# Patient Record
Sex: Male | Born: 1980 | Race: White | Marital: Married | Smoking: Former smoker
Health system: Southern US, Community
[De-identification: ages and names within clinical notes are randomized; demographics above are authoritative.]

## PROBLEM LIST (undated history)

## (undated) DIAGNOSIS — I10 Essential (primary) hypertension: Secondary | ICD-10-CM

## (undated) DIAGNOSIS — T7840XA Allergy, unspecified, initial encounter: Secondary | ICD-10-CM

## (undated) DIAGNOSIS — I493 Ventricular premature depolarization: Secondary | ICD-10-CM

## (undated) DIAGNOSIS — R42 Dizziness and giddiness: Secondary | ICD-10-CM

## (undated) DIAGNOSIS — R002 Palpitations: Secondary | ICD-10-CM

## (undated) DIAGNOSIS — I491 Atrial premature depolarization: Secondary | ICD-10-CM

## (undated) DIAGNOSIS — K219 Gastro-esophageal reflux disease without esophagitis: Secondary | ICD-10-CM

## (undated) HISTORY — DX: Allergy, unspecified, initial encounter: T78.40XA

## (undated) HISTORY — DX: Essential (primary) hypertension: I10

## (undated) HISTORY — PX: OTHER SURGICAL HISTORY: SHX169

## (undated) HISTORY — DX: Palpitations: R00.2

## (undated) HISTORY — DX: Dizziness and giddiness: R42

## (undated) HISTORY — PX: COLONOSCOPY: SHX174

## (undated) HISTORY — DX: Atrial premature depolarization: I49.1

## (undated) HISTORY — DX: Gastro-esophageal reflux disease without esophagitis: K21.9

## (undated) HISTORY — DX: Ventricular premature depolarization: I49.3

---

## 2015-11-26 ENCOUNTER — Encounter: Payer: Self-pay | Admitting: Cardiology

## 2015-11-26 ENCOUNTER — Ambulatory Visit (INDEPENDENT_AMBULATORY_CARE_PROVIDER_SITE_OTHER): Payer: 59 | Admitting: Cardiology

## 2015-11-26 VITALS — BP 144/88 | HR 60 | Ht 71.0 in | Wt 234.8 lb

## 2015-11-26 DIAGNOSIS — R011 Cardiac murmur, unspecified: Secondary | ICD-10-CM

## 2015-11-26 DIAGNOSIS — R002 Palpitations: Secondary | ICD-10-CM | POA: Diagnosis not present

## 2015-11-26 DIAGNOSIS — R079 Chest pain, unspecified: Secondary | ICD-10-CM

## 2015-11-26 DIAGNOSIS — I491 Atrial premature depolarization: Secondary | ICD-10-CM | POA: Insufficient documentation

## 2015-11-26 NOTE — Progress Notes (Signed)
Cardiology Office Note   Date:  11/26/2015   ID:  Spencer Jackson, DOB 1981/07/09, MRN [id number]  PCP:  No primary care provider on file.    Chief Complaint  Patient presents with  . Chest Pain  . Palpitations      History of Present Illness: Spencer Jackson is a 35 y.o. male who presents for evaluation of.  He has a history of HTN, palpitations with PACs on EKG and dizziness in the past.  He had an echo in [place] showing normal LVF with mild LAE and mild MR. It was felt that he may have post prandial hypotension and it was recommended that he avoid ETOH at lunch and do lighter meals.   He does not smoke currently but did smoke and quit 10 year ago.  He drinks 3-4 alcoholic drinks per day.  He is here today because he had some palpitations a week ago and also wants his BP evaluated.  He says that the palpitations are intermittent and have occurred for years.  It can occur when he is sitting at his desk.  It feels like a skipped beat.  He will notice it some when he is exercising.  He has had some chest pain that is midsternal with no radiation and is nonexertional.  He has no associated symptoms of SOB, diaphoresis or nausea.  He can exercise without any problems except for the palpitations that sometimes make him stop.  He denies any LE edema, PND, orthopnea or syncope.      Past Medical History  Diagnosis Date  . Palpitations   . Dizziness   . Hypertension   . PAC (premature atrial contraction)     Past Surgical History  Procedure Laterality Date  . Back cyst resection       No current outpatient prescriptions on file.   No current facility-administered medications for this visit.    Allergies:   Review of patient's allergies indicates not on file.    Social History:  The patient  reports that he quit smoking about 10 years ago. He does not have any smokeless tobacco history on file. He reports that he drinks about 5.4 oz of alcohol per week. He reports that he  does not use illicit drugs.   Family History:  The patient's family history includes Hepatitis in his mother.    ROS:  Please see the history of present illness.   Otherwise, review of systems are positive for none.   All other systems are reviewed and negative.    PHYSICAL EXAM: VS:  BP 144/88 mmHg  Pulse 60  Ht 5\' 11"  (1.803 m)  Wt 234 lb 12.8 oz (106.505 kg)  BMI 32.76 kg/m2 , BMI Body mass index is 32.76 kg/(m^2). GEN: Well nourished, well developed, in no acute distress HEENT: normal Neck: no JVD, carotid bruits, or masses Cardiac: RRR; no rubs, or gallops,no edema,  1/6 SM at RUSB Respiratory:  clear to auscultation bilaterally, normal work of breathing GI: soft, nontender, nondistended, + BS MS: no deformity or atrophy Skin: warm and dry, no rash Neuro:  Strength and sensation are intact Psych: euthymic mood, full affect   EKG:  EKG is ordered today. The ekg ordered today demonstrates NSR at 60bpm with no ST changes, intervals normal   Recent Labs: No results found for requested labs within last 365 days.    Lipid Panel No results found  for: CHOL, TRIG, HDL, CHOLHDL, VLDL, LDLCALC, LDLDIRECT    Wt Readings from Last 3 Encounters:  11/26/15 234 lb 12.8 oz (106.505 kg)        ASSESSMENT AND PLAN:  1.  Chest pain which he thinks is probably indigestion but wanted to make sure.  He does not smoke currently but did 10 years ago.  His EKG is normal.  His only CRFs are remote tobacco use and  HTN.  I will get an ETT to rule out ischemia.    2.  HTN - borderline controlled.  He brought in his BP readings.  Most are after a workout.  I have encouraged him to cut out added sodium in his diet.  I have asked him to check his BP daily for a week and call with the results.   3.  Palpitations with history of PACs.  I suspect he is having PACs and PVCs.  I have encouraged him to try to cut back on caffeine and alcohol.  I will get a 30 day heart monitor to assess.   4.  Heart  murmur - check 2D echo   Current medicines are reviewed at length with the patient today.  The patient does not have concerns regarding medicines.  The following changes have been made:  no change  Labs/ tests ordered today: See above Assessment and Plan No orders of the defined types were placed in this encounter.     Disposition:   FU with me in 1 year  Signed, Sueanne Margarita, MD  11/26/2015 8:39 AM    [facility] [address], [place], [place]  [address] Phone: [phone]; Fax: [phone]

## 2015-11-26 NOTE — Patient Instructions (Addendum)
Medication Instructions:  Your physician recommends that you continue on your current medications as directed. Please refer to the Current Medication list given to you today.   Labwork: None  Testing/Procedures: Your physician has requested that you have an echocardiogram. Echocardiography is a painless test that uses sound waves to create images of your heart. It provides your doctor with information about the size and shape of your heart and how well your heart's chambers and valves are working. This procedure takes approximately one hour. There are no restrictions for this procedure.  Your physician has recommended that you wear an event monitor. Event monitors are medical devices that record the heart's electrical activity. Doctors most often [place] these monitors to diagnose arrhythmias. Arrhythmias are problems with the speed or rhythm of the heartbeat. The monitor is a small, portable device. You can wear one while you do your normal daily activities. This is usually used to diagnose what is causing palpitations/syncope (passing out).  Your physician has requested that you have an exercise tolerance test. For further information please visit [email or web address]. Please also follow instruction sheet, as given.  Follow-Up: Your physician wants you to follow-up in: 1 YEAR with Dr. Radford Pax. You will receive a reminder letter in the mail two months in advance. If you don't receive a letter, please call our office to schedule the follow-up appointment.   Any Other Special Instructions Will Be Listed Below (If Applicable).     If you need a refill on your cardiac medications before your next appointment, please call your pharmacy.

## 2015-12-08 ENCOUNTER — Ambulatory Visit: Payer: Self-pay | Admitting: Internal Medicine

## 2015-12-15 ENCOUNTER — Other Ambulatory Visit: Payer: Self-pay

## 2015-12-15 ENCOUNTER — Ambulatory Visit (INDEPENDENT_AMBULATORY_CARE_PROVIDER_SITE_OTHER): Payer: 59

## 2015-12-15 ENCOUNTER — Ambulatory Visit (HOSPITAL_COMMUNITY): Payer: 59 | Attending: Cardiovascular Disease

## 2015-12-15 ENCOUNTER — Other Ambulatory Visit (HOSPITAL_COMMUNITY): Payer: 59

## 2015-12-15 DIAGNOSIS — R079 Chest pain, unspecified: Secondary | ICD-10-CM | POA: Diagnosis not present

## 2015-12-15 DIAGNOSIS — R011 Cardiac murmur, unspecified: Secondary | ICD-10-CM | POA: Diagnosis not present

## 2015-12-15 DIAGNOSIS — Z87891 Personal history of nicotine dependence: Secondary | ICD-10-CM | POA: Insufficient documentation

## 2015-12-15 DIAGNOSIS — R002 Palpitations: Secondary | ICD-10-CM | POA: Diagnosis not present

## 2015-12-15 DIAGNOSIS — I491 Atrial premature depolarization: Secondary | ICD-10-CM | POA: Diagnosis not present

## 2015-12-15 DIAGNOSIS — I119 Hypertensive heart disease without heart failure: Secondary | ICD-10-CM | POA: Insufficient documentation

## 2015-12-15 LAB — EXERCISE TOLERANCE TEST
CHL CUP MPHR: 186 {beats}/min
CHL CUP RESTING HR STRESS: 65 {beats}/min
CHL CUP STRESS STAGE 1 HR: 54 {beats}/min
CHL CUP STRESS STAGE 10 HR: 84 {beats}/min
CHL CUP STRESS STAGE 2 GRADE: 0 %
CHL CUP STRESS STAGE 2 HR: 54 {beats}/min
CHL CUP STRESS STAGE 2 SPEED: 1 mph
CHL CUP STRESS STAGE 3 GRADE: 0.1 %
CHL CUP STRESS STAGE 5 SPEED: 2.5 mph
CHL CUP STRESS STAGE 6 GRADE: 14 %
CHL CUP STRESS STAGE 6 HR: 136 {beats}/min
CHL CUP STRESS STAGE 6 SPEED: 3.4 mph
CHL CUP STRESS STAGE 7 DBP: 66 mmHg
CHL CUP STRESS STAGE 7 GRADE: 16 %
CHL CUP STRESS STAGE 7 HR: 157 {beats}/min
CHL CUP STRESS STAGE 7 SBP: 125 mmHg
CHL CUP STRESS STAGE 8 HR: 171 {beats}/min
CHL CUP STRESS STAGE 9 SBP: 154 mmHg
CHL RATE OF PERCEIVED EXERTION: 18
CSEPED: 12 min
CSEPEDS: 53 s
CSEPEW: 14.9 METS
CSEPHR: 91 %
CSEPPHR: 171 {beats}/min
Percent of predicted max HR: 91 %
Stage 1 DBP: 82 mmHg
Stage 1 Grade: 0 %
Stage 1 SBP: 131 mmHg
Stage 1 Speed: 0 mph
Stage 10 DBP: 87 mmHg
Stage 10 Grade: 0 %
Stage 10 SBP: 152 mmHg
Stage 10 Speed: 0 mph
Stage 3 HR: 54 {beats}/min
Stage 3 Speed: 1 mph
Stage 4 DBP: 73 mmHg
Stage 4 Grade: 10 %
Stage 4 HR: 80 {beats}/min
Stage 4 SBP: 138 mmHg
Stage 4 Speed: 1.7 mph
Stage 5 DBP: 76 mmHg
Stage 5 Grade: 12 %
Stage 5 HR: 105 {beats}/min
Stage 5 SBP: 151 mmHg
Stage 6 DBP: 69 mmHg
Stage 6 SBP: 151 mmHg
Stage 7 Speed: 4.1 mph
Stage 8 Grade: 18 %
Stage 8 Speed: 5 mph
Stage 9 DBP: 68 mmHg
Stage 9 Grade: 0 %
Stage 9 HR: 130 {beats}/min
Stage 9 Speed: 1.5 mph

## 2016-02-03 ENCOUNTER — Telehealth: Payer: Self-pay | Admitting: Cardiology

## 2016-02-03 NOTE — Telephone Encounter (Signed)
New message ° ° ° ° °Returning a call to the nurse °

## 2016-02-03 NOTE — Telephone Encounter (Signed)
Informed patient of results and verbal understanding expressed.  

## 2016-02-03 NOTE — Telephone Encounter (Signed)
-----   Message from Sueanne Margarita, MD sent at 02/02/2016  9:24 PM EDT ----- Please let patient know that stress test was fine

## 2016-05-30 ENCOUNTER — Ambulatory Visit (INDEPENDENT_AMBULATORY_CARE_PROVIDER_SITE_OTHER): Payer: 59 | Admitting: Family Medicine

## 2016-05-30 ENCOUNTER — Encounter: Payer: Self-pay | Admitting: Family Medicine

## 2016-05-30 VITALS — BP 130/86 | HR 50 | Wt 233.0 lb

## 2016-05-30 DIAGNOSIS — M25812 Other specified joint disorders, left shoulder: Secondary | ICD-10-CM

## 2016-05-30 DIAGNOSIS — M7542 Impingement syndrome of left shoulder: Secondary | ICD-10-CM | POA: Diagnosis not present

## 2016-05-30 DIAGNOSIS — M4316 Spondylolisthesis, lumbar region: Secondary | ICD-10-CM | POA: Diagnosis not present

## 2016-05-30 DIAGNOSIS — Z23 Encounter for immunization: Secondary | ICD-10-CM | POA: Diagnosis not present

## 2016-05-30 DIAGNOSIS — Z114 Encounter for screening for human immunodeficiency virus [HIV]: Secondary | ICD-10-CM

## 2016-05-30 DIAGNOSIS — Z0000 Encounter for general adult medical examination without abnormal findings: Secondary | ICD-10-CM

## 2016-05-30 DIAGNOSIS — IMO0001 Reserved for inherently not codable concepts without codable children: Secondary | ICD-10-CM

## 2016-05-30 DIAGNOSIS — M7540 Impingement syndrome of unspecified shoulder: Secondary | ICD-10-CM | POA: Insufficient documentation

## 2016-05-30 DIAGNOSIS — F32A Depression, unspecified: Secondary | ICD-10-CM

## 2016-05-30 DIAGNOSIS — F329 Major depressive disorder, single episode, unspecified: Secondary | ICD-10-CM

## 2016-05-30 LAB — COMPREHENSIVE METABOLIC PANEL
ALBUMIN: 4.4 g/dL (ref 3.6–5.1)
ALK PHOS: 44 U/L (ref 40–115)
ALT: 14 U/L (ref 9–46)
AST: 20 U/L (ref 10–40)
BILIRUBIN TOTAL: 0.4 mg/dL (ref 0.2–1.2)
BUN: 11 mg/dL (ref 7–25)
CALCIUM: 8.9 mg/dL (ref 8.6–10.3)
CO2: 23 mmol/L (ref 20–31)
Chloride: 104 mmol/L (ref 98–110)
Creat: 0.96 mg/dL (ref 0.60–1.35)
GLUCOSE: 96 mg/dL (ref 65–99)
POTASSIUM: 4.1 mmol/L (ref 3.5–5.3)
Sodium: 136 mmol/L (ref 135–146)
Total Protein: 7.1 g/dL (ref 6.1–8.1)

## 2016-05-30 LAB — LIPID PANEL
CHOLESTEROL: 129 mg/dL (ref 125–200)
HDL: 42 mg/dL (ref >=40)
LDL Cholesterol: 70 mg/dL (ref <130)
TRIGLYCERIDES: 85 mg/dL (ref <150)
Total CHOL/HDL Ratio: 3.1 Ratio (ref <=5.0)
VLDL: 17 mg/dL (ref <30)

## 2016-05-30 LAB — CBC
HEMATOCRIT: 44.9 % (ref 38.5–50.0)
HEMOGLOBIN: 15.5 g/dL (ref 13.2–17.1)
MCH: 30.4 pg (ref 27.0–33.0)
MCHC: 34.5 g/dL (ref 32.0–36.0)
MCV: 88 fL (ref 80.0–100.0)
MPV: 9.7 fL (ref 7.5–12.5)
Platelets: 237 10*3/uL (ref 140–400)
RBC: 5.1 MIL/uL (ref 4.20–5.80)
RDW: 12.8 % (ref 11.0–15.0)
WBC: 5 10*3/uL (ref 3.8–10.8)

## 2016-05-30 LAB — TSH: TSH: 1.55 m[IU]/L (ref 0.40–4.50)

## 2016-05-30 NOTE — Patient Instructions (Signed)
Thank you for coming in today. Get fasting labs today.  Return in 6 months.  You should hear from therapy soon. Call back if you do not hear anything.   Shoulder: Exercises below. When lifting keep your hands in your peripheral vision.   Back: Spinal listhesis. Work on core strengthening exercises. Avoid excessive back extension.   Impingement Syndrome, Rotator Cuff, Bursitis With Rehab Impingement syndrome is a condition that involves inflammation of the tendons of the rotator cuff and the subacromial bursa, that causes pain in the shoulder. The rotator cuff consists of four tendons and muscles that control much of the shoulder and upper arm function. The subacromial bursa is a fluid filled sac that helps reduce friction between the rotator cuff and one of the bones of the shoulder (acromion). Impingement syndrome is usually an overuse injury that causes swelling of the bursa (bursitis), swelling of the tendon (tendonitis), and/or a tear of the tendon (strain). Strains are classified into three categories. Grade 1 strains cause pain, but the tendon is not lengthened. Grade 2 strains include a lengthened ligament, due to the ligament being stretched or partially ruptured. With grade 2 strains there is still function, although the function may be decreased. Grade 3 strains include a complete tear of the tendon or muscle, and function is usually impaired. SYMPTOMS   Pain around the shoulder, often at the outer portion of the upper arm.  Pain that gets worse with shoulder function, especially when reaching overhead or lifting.  Sometimes, aching when not using the arm.  Pain that wakes you up at night.  Sometimes, tenderness, swelling, warmth, or redness over the affected area.  Loss of strength.  Limited motion of the shoulder, especially reaching behind the back (to the back pocket or to unhook bra) or across your body.  Crackling sound (crepitation) when moving the arm.  Biceps tendon  pain and inflammation (in the front of the shoulder). Worse when bending the elbow or lifting. CAUSES  Impingement syndrome is often an overuse injury, in which chronic (repetitive) motions cause the tendons or bursa to become inflamed. A strain occurs when a force is paced on the tendon or muscle that is greater than it can withstand. Common mechanisms of injury include: Stress from sudden increase in duration, frequency, or intensity of training.  Direct hit (trauma) to the shoulder.  Aging, erosion of the tendon with normal use.  Bony bump on shoulder (acromial spur). RISK INCREASES WITH:  Contact sports (football, wrestling, boxing).  Throwing sports (baseball, tennis, volleyball).  Weightlifting and bodybuilding.  Heavy labor.  Previous injury to the rotator cuff, including impingement.  Poor shoulder strength and flexibility.  Failure to warm up properly before activity.  Inadequate protective equipment.  Old age.  Bony bump on shoulder (acromial spur). PREVENTION   Warm up and stretch properly before activity.  Allow for adequate recovery between workouts.  Maintain physical fitness:  Strength, flexibility, and endurance.  Cardiovascular fitness.  Learn and use proper exercise technique. PROGNOSIS  If treated properly, impingement syndrome usually goes away within 6 weeks. Sometimes surgery is required.  RELATED COMPLICATIONS   Longer healing time if not properly treated, or if not given enough time to heal.  Recurring symptoms, that result in a chronic condition.  Shoulder stiffness, frozen shoulder, or loss of motion.  Rotator cuff tendon tear.  Recurring symptoms, especially if activity is resumed too soon, with overuse, with a direct blow, or when using poor technique. TREATMENT  Treatment first involves  the use of ice and medicine, to reduce pain and inflammation. The use of strengthening and stretching exercises may help reduce pain with  activity. These exercises may be performed at home or with a therapist. If non-surgical treatment is unsuccessful after more than 6 months, surgery may be advised. After surgery and rehabilitation, activity is usually possible in 3 months.  MEDICATION  If pain medicine is needed, nonsteroidal anti-inflammatory medicines (aspirin and ibuprofen), or other minor pain relievers (acetaminophen), are often advised.  Do not take pain medicine for 7 days before surgery.  Prescription pain relievers may be given, if your caregiver thinks they are needed. Use only as directed and only as much as you need.  Corticosteroid injections may be given by your caregiver. These injections should be reserved for the most serious cases, because they may only be given a certain number of times. HEAT AND COLD  Cold treatment (icing) should be applied for 10 to 15 minutes every 2 to 3 hours for inflammation and pain, and immediately after activity that aggravates your symptoms. Use ice packs or an ice massage.  Heat treatment may be used before performing stretching and strengthening activities prescribed by your caregiver, physical therapist, or athletic trainer. Use a heat pack or a warm water soak. SEEK MEDICAL CARE IF:   Symptoms get worse or do not improve in 4 to 6 weeks, despite treatment.  New, unexplained symptoms develop. (Drugs used in treatment may produce side effects.) EXERCISES  RANGE OF MOTION (ROM) AND STRETCHING EXERCISES - Impingement Syndrome (Rotator Cuff  Tendinitis, Bursitis) These exercises may help you when beginning to rehabilitate your injury. Your symptoms may go away with or without further involvement from your physician, physical therapist or athletic trainer. While completing these exercises, remember:   Restoring tissue flexibility helps normal motion to return to the joints. This allows healthier, less painful movement and activity.  An effective stretch should be held for at  least 30 seconds.  A stretch should never be painful. You should only feel a gentle lengthening or release in the stretched tissue. STRETCH - Flexion, Standing  Stand with good posture. With an underhand grip on your right / left hand, and an overhand grip on the opposite hand, grasp a broomstick or cane so that your hands are a little more than shoulder width apart.  Keeping your right / left elbow straight and shoulder muscles relaxed, push the stick with your opposite hand, to raise your right / left arm in front of your body and then overhead. Raise your arm until you feel a stretch in your right / left shoulder, but before you have increased shoulder pain.  Try to avoid shrugging your right / left shoulder as your arm rises, by keeping your shoulder blade tucked down and toward your mid-back spine. Hold for __________ seconds.  Slowly return to the starting position. Repeat __________ times. Complete this exercise __________ times per day. STRETCH - Abduction, Supine  Lie on your back. With an underhand grip on your right / left hand and an overhand grip on the opposite hand, grasp a broomstick or cane so that your hands are a little more than shoulder width apart.  Keeping your right / left elbow straight and your shoulder muscles relaxed, push the stick with your opposite hand, to raise your right / left arm out to the side of your body and then overhead. Raise your arm until you feel a stretch in your right / left shoulder, but before  you have increased shoulder pain.  Try to avoid shrugging your right / left shoulder as your arm rises, by keeping your shoulder blade tucked down and toward your mid-back spine. Hold for __________ seconds.  Slowly return to the starting position. Repeat __________ times. Complete this exercise __________ times per day. ROM - Flexion, Active-Assisted  Lie on your back. You may bend your knees for comfort.  Grasp a broomstick or cane so your hands are  about shoulder width apart. Your right / left hand should grip the end of the stick, so that your hand is positioned "thumbs-up," as if you were about to shake hands.  Using your healthy arm to lead, raise your right / left arm overhead, until you feel a gentle stretch in your shoulder. Hold for __________ seconds.  Use the stick to assist in returning your right / left arm to its starting position. Repeat __________ times. Complete this exercise __________ times per day.  ROM - Internal Rotation, Supine   Lie on your back on a firm surface. Place your right / left elbow about 60 degrees away from your side. Elevate your elbow with a folded towel, so that the elbow and shoulder are the same height.  Using a broomstick or cane and your strong arm, pull your right / left hand toward your body until you feel a gentle stretch, but no increase in your shoulder pain. Keep your shoulder and elbow in place throughout the exercise.  Hold for __________ seconds. Slowly return to the starting position. Repeat __________ times. Complete this exercise __________ times per day. STRETCH - Internal Rotation  Place your right / left hand behind your back, palm up.  Throw a towel or belt over your opposite shoulder. Grasp the towel with your right / left hand.  While keeping an upright posture, gently pull up on the towel, until you feel a stretch in the front of your right / left shoulder.  Avoid shrugging your right / left shoulder as your arm rises, by keeping your shoulder blade tucked down and toward your mid-back spine.  Hold for __________ seconds. Release the stretch, by lowering your healthy hand. Repeat __________ times. Complete this exercise __________ times per day. ROM - Internal Rotation   Using an underhand grip, grasp a stick behind your back with both hands.  While standing upright with good posture, slide the stick up your back until you feel a mild stretch in the front of your  shoulder.  Hold for __________ seconds. Slowly return to your starting position. Repeat __________ times. Complete this exercise __________ times per day.  STRETCH - Posterior Shoulder Capsule   Stand or sit with good posture. Grasp your right / left elbow and draw it across your chest, keeping it at the same height as your shoulder.  Pull your elbow, so your upper arm comes in closer to your chest. Pull until you feel a gentle stretch in the back of your shoulder.  Hold for __________ seconds. Repeat __________ times. Complete this exercise __________ times per day. STRENGTHENING EXERCISES - Impingement Syndrome (Rotator Cuff Tendinitis, Bursitis) These exercises may help you when beginning to rehabilitate your injury. They may resolve your symptoms with or without further involvement from your physician, physical therapist or athletic trainer. While completing these exercises, remember:  Muscles can gain both the endurance and the strength needed for everyday activities through controlled exercises.  Complete these exercises as instructed by your physician, physical therapist or athletic trainer. Increase the  resistance and repetitions only as guided.  You may experience muscle soreness or fatigue, but the pain or discomfort you are trying to eliminate should never worsen during these exercises. If this pain does get worse, stop and make sure you are following the directions exactly. If the pain is still present after adjustments, discontinue the exercise until you can discuss the trouble with your clinician.  During your recovery, avoid activity or exercises which involve actions that place your injured hand or elbow above your head or behind your back or head. These positions stress the tissues which you are trying to heal. STRENGTH - Scapular Depression and Adduction   With good posture, sit on a firm chair. Support your arms in front of you, with pillows, arm rests, or on a table top.  Have your elbows in line with the sides of your body.  Gently draw your shoulder blades down and toward your mid-back spine. Gradually increase the tension, without tensing the muscles along the top of your shoulders and the back of your neck.  Hold for __________ seconds. Slowly release the tension and relax your muscles completely before starting the next repetition.  After you have practiced this exercise, remove the arm support and complete the exercise in standing as well as sitting position. Repeat __________ times. Complete this exercise __________ times per day.  STRENGTH - Shoulder Abductors, Isometric  With good posture, stand or sit about 4-6 inches from a wall, with your right / left side facing the wall.  Bend your right / left elbow. Gently press your right / left elbow into the wall. Increase the pressure gradually, until you are pressing as hard as you can, without shrugging your shoulder or increasing any shoulder discomfort.  Hold for __________ seconds.  Release the tension slowly. Relax your shoulder muscles completely before you begin the next repetition. Repeat __________ times. Complete this exercise __________ times per day.  STRENGTH - External Rotators, Isometric  Keep your right / left elbow at your side and bend it 90 degrees.  Step into a door frame so that the outside of your right / left wrist can press against the door frame without your upper arm leaving your side.  Gently press your right / left wrist into the door frame, as if you were trying to swing the back of your hand away from your stomach. Gradually increase the tension, until you are pressing as hard as you can, without shrugging your shoulder or increasing any shoulder discomfort.  Hold for __________ seconds.  Release the tension slowly. Relax your shoulder muscles completely before you begin the next repetition. Repeat __________ times. Complete this exercise __________ times per day.    STRENGTH - Supraspinatus   Stand or sit with good posture. Grasp a __________ weight, or an exercise band or tubing, so that your hand is "thumbs-up," like you are shaking hands.  Slowly lift your right / left arm in a "V" away from your thigh, diagonally into the space between your side and straight ahead. Lift your hand to shoulder height or as far as you can, without increasing any shoulder pain. At first, many people do not lift their hands above shoulder height.  Avoid shrugging your right / left shoulder as your arm rises, by keeping your shoulder blade tucked down and toward your mid-back spine.  Hold for __________ seconds. Control the descent of your hand, as you slowly return to your starting position. Repeat __________ times. Complete this exercise __________ times  per day.  STRENGTH - External Rotators  Secure a rubber exercise band or tubing to a fixed object (table, pole) so that it is at the same height as your right / left elbow when you are standing or sitting on a firm surface.  Stand or sit so that the secured exercise band is at your uninjured side.  Bend your right / left elbow 90 degrees. Place a folded towel or small pillow under your right / left arm, so that your elbow is a few inches away from your side.  Keeping the tension on the exercise band, pull it away from your body, as if pivoting on your elbow. Be sure to keep your body steady, so that the movement is coming only from your rotating shoulder.  Hold for __________ seconds. Release the tension in a controlled manner, as you return to the starting position. Repeat __________ times. Complete this exercise __________ times per day.  STRENGTH - Internal Rotators   Secure a rubber exercise band or tubing to a fixed object (table, pole) so that it is at the same height as your right / left elbow when you are standing or sitting on a firm surface.  Stand or sit so that the secured exercise band is at your right /  left side.  Bend your elbow 90 degrees. Place a folded towel or small pillow under your right / left arm so that your elbow is a few inches away from your side.  Keeping the tension on the exercise band, pull it across your body, toward your stomach. Be sure to keep your body steady, so that the movement is coming only from your rotating shoulder.  Hold for __________ seconds. Release the tension in a controlled manner, as you return to the starting position. Repeat __________ times. Complete this exercise __________ times per day.  STRENGTH - Scapular Protractors, Standing   Stand arms length away from a wall. Place your hands on the wall, keeping your elbows straight.  Begin by dropping your shoulder blades down and toward your mid-back spine.  To strengthen your protractors, keep your shoulder blades down, but slide them forward on your rib cage. It will feel as if you are lifting the back of your rib cage away from the wall. This is a subtle motion and can be challenging to complete. Ask your caregiver for further instruction, if you are not sure you are doing the exercise correctly.  Hold for __________ seconds. Slowly return to the starting position, resting the muscles completely before starting the next repetition. Repeat __________ times. Complete this exercise __________ times per day. STRENGTH - Scapular Protractors, Supine  Lie on your back on a firm surface. Extend your right / left arm straight into the air while holding a __________ weight in your hand.  Keeping your head and back in place, lift your shoulder off the floor.  Hold for __________ seconds. Slowly return to the starting position, and allow your muscles to relax completely before starting the next repetition. Repeat __________ times. Complete this exercise __________ times per day. STRENGTH - Scapular Protractors, Quadruped  Get onto your hands and knees, with your shoulders directly over your hands (or as close  as you can be, comfortably).  Keeping your elbows locked, lift the back of your rib cage up into your shoulder blades, so your mid-back rounds out. Keep your neck muscles relaxed.  Hold this position for __________ seconds. Slowly return to the starting position and allow your muscles  to relax completely before starting the next repetition. Repeat __________ times. Complete this exercise __________ times per day.  STRENGTH - Scapular Retractors  Secure a rubber exercise band or tubing to a fixed object (table, pole), so that it is at the height of your shoulders when you are either standing, or sitting on a firm armless chair.  With a palm down grip, grasp an end of the band in each hand. Straighten your elbows and lift your hands straight in front of you, at shoulder height. Step back, away from the secured end of the band, until it becomes tense.  Squeezing your shoulder blades together, draw your elbows back toward your sides, as you bend them. Keep your upper arms lifted away from your body throughout the exercise.  Hold for __________ seconds. Slowly ease the tension on the band, as you reverse the directions and return to the starting position. Repeat __________ times. Complete this exercise __________ times per day. STRENGTH - Shoulder Extensors   Secure a rubber exercise band or tubing to a fixed object (table, pole) so that it is at the height of your shoulders when you are either standing, or sitting on a firm armless chair.  With a thumbs-up grip, grasp an end of the band in each hand. Straighten your elbows and lift your hands straight in front of you, at shoulder height. Step back, away from the secured end of the band, until it becomes tense.  Squeezing your shoulder blades together, pull your hands down to the sides of your thighs. Do not allow your hands to go behind you.  Hold for __________ seconds. Slowly ease the tension on the band, as you reverse the directions and  return to the starting position. Repeat __________ times. Complete this exercise __________ times per day.  STRENGTH - Scapular Retractors and External Rotators   Secure a rubber exercise band or tubing to a fixed object (table, pole) so that it is at the height as your shoulders, when you are either standing, or sitting on a firm armless chair.  With a palm down grip, grasp an end of the band in each hand. Bend your elbows 90 degrees and lift your elbows to shoulder height, at your sides. Step back, away from the secured end of the band, until it becomes tense.  Squeezing your shoulder blades together, rotate your shoulders so that your upper arms and elbows remain stationary, but your fists travel upward to head height.  Hold for __________ seconds. Slowly ease the tension on the band, as you reverse the directions and return to the starting position. Repeat __________ times. Complete this exercise __________ times per day.  STRENGTH - Scapular Retractors and External Rotators, Rowing   Secure a rubber exercise band or tubing to a fixed object (table, pole) so that it is at the height of your shoulders, when you are either standing, or sitting on a firm armless chair.  With a palm down grip, grasp an end of the band in each hand. Straighten your elbows and lift your hands straight in front of you, at shoulder height. Step back, away from the secured end of the band, until it becomes tense.  Step 1: Squeeze your shoulder blades together. Bending your elbows, draw your hands to your chest, as if you are rowing a boat. At the end of this motion, your hands and elbow should be at shoulder height and your elbows should be out to your sides.  Step 2: Rotate your shoulders,  to raise your hands above your head. Your forearms should be vertical and your upper arms should be horizontal.  Hold for __________ seconds. Slowly ease the tension on the band, as you reverse the directions and return to the  starting position. Repeat __________ times. Complete this exercise __________ times per day.  STRENGTH - Scapular Depressors  Find a sturdy chair without wheels, such as a dining room chair.  Keeping your feet on the floor, and your hands on the chair arms, lift your bottom up from the seat, and lock your elbows.  Keeping your elbows straight, allow gravity to pull your body weight down. Your shoulders will rise toward your ears.  Raise your body against gravity by drawing your shoulder blades down your back, shortening the distance between your shoulders and ears. Although your feet should always maintain contact with the floor, your feet should progressively support less body weight, as you get stronger.  Hold for __________ seconds. In a controlled and slow manner, lower your body weight to begin the next repetition. Repeat __________ times. Complete this exercise __________ times per day.    This information is not intended to replace advice given to you by your health care provider. Make sure you discuss any questions you have with your health care provider.   Document Released: 08/28/2005 Document Revised: 09/18/2014 Document Reviewed: 12/10/2008 [employer] Interactive Patient Education 2016 [employer] The slipping of one or multiple vertebrae out of the correct anatomical position is a condition known as spondylolisthesis. Spondylolisthesis is most common in adolescents and is caused by a number of different reasons, such as vertebral fracture or something you are born with (congenital). Spondylolisthesis is diagnosed with the use of X-rays. SYMPTOMS   Dull, achy pain in the lower back.  Pain that worsens with extension of the spine.  Tightness of the muscles on the back of the thigh.  Lower back stiffness.  Signs of nerve damage: pain, numbness, or weakness affecting one or both lower extremities.  Muscle wasting (atrophy),  uncommon.  Loss of stool (bowel) or urine (bladder) function. CAUSES  The symptoms of spondylolisthesis are caused by one or more vertebrae that are out of alignment, placing pressure on the spinal cord. Common mechanisms of injury include:  Congenital defect of the spine.  Degenerative process.  Stress fracture of the spine.  Fracture due to trauma to the spine. RISK INCREASES WITH:  Activities that have a risk of hyperextending the back.  Activities that have a risk of excessively rotating the spine.  Poor strength and flexibility.  Failure to warm up properly before activity.  Family history of spondylolysis or spondylolisthesis.  Improper sports technique. PREVENTION  Warm up and stretch properly before activity.  Allow for adequate recovery between workouts.  Maintain physical fitness:  Strength, flexibility, and endurance.  Cardiovascular fitness.  Learn and use proper technique. When possible, have a coach correct improper technique. PROGNOSIS  If treated properly, the spondylolisthesis usually resolves. RELATED COMPLICATIONS   Recurrent symptoms that result in a chronic problem.  Inability to compete in athletics.  Prolonged healing time, if improperly treated or reinjured.  Failure of the fracture to heal (nonunion).  Healing of the fracture in a poor position (malunion). TREATMENT Treatment initially involves resting from any activities that aggravate the symptoms and the use of ice and medications to help reduce pain and inflammation. The use of strengthening and stretching exercises may help reduce pain with activity. These exercises may  be performed at home or with referral to a therapist. It is important to learn how to use proper body mechanics as to not place undue stress on your spine. If the injury is severe, then your caregiver may recommend a back brace to allow for healing, or even surgery. Surgery often involves fusing two adjacent vertebrae  so no movement is allowed between them.  MEDICATION   If pain medication is necessary, then nonsteroidal anti-inflammatory medications, such as aspirin and ibuprofen, or other minor pain relievers, such as acetaminophen, are often recommended.  Do not take pain medication for 7 days before surgery.  Prescription pain relievers may be given if deemed necessary by your caregiver. Use only as directed and only as much as you need. HEAT AND COLD  Cold treatment (icing) relieves pain and reduces inflammation. Cold treatment should be applied for 10 to 15 minutes every 2 to 3 hours for inflammation and pain and immediately after any activity that aggravates your symptoms. Use ice packs or massage the area with a piece of ice (ice massage).  Heat treatment may be used prior to performing the stretching and strengthening activities prescribed by your caregiver, physical therapist, or athletic trainer. Use a heat pack or soak the injury in warm water. SEEK MEDICAL CARE IF:  Treatment seems to offer no benefit, or the condition worsens.  Any medications produce adverse side effects.  Any complications from surgery occur:  Pain, numbness, or coldness in the extremity operated upon.  Discoloration of the nail beds (they become blue or gray) of the extremity operated upon.  Signs of infections (fever, pain, inflammation, redness, or persistent bleeding). EXERCISES RANGE OF MOTION (ROM) AND STRETCHING EXERCISES - Spondylolisthesis Most people with low back pain will find that their symptoms worsen with either excessive bending forward (flexion) or arching at the low back (extension). The exercises which will help resolve your symptoms will focus on the opposite motion. Your physician, physical therapist or athletic trainer will help you determine which exercises will be most helpful to resolve your low back pain. Do not complete any exercises without first consulting with your clinician. Discontinue  any exercises which worsen your symptoms until you speak to your clinician. If you have pain, numbness or tingling which travels down into your buttocks, leg, or foot, the goal of the therapy is for these symptoms to move closer to your back and eventually resolve. Occasionally, these leg symptoms will get better, but your low back pain may worsen; this is typically an indication of progress in your rehabilitation. Be certain to be very alert to any changes in your symptoms and the activities in which you participated in the 24 hours prior to the change. Sharing this information with your clinician will allow him/her to most efficiently treat your condition. These exercises may help you when beginning to rehabilitate your injury. Your symptoms may resolve with or without further involvement from your physician, physical therapist or athletic trainer. While completing these exercises, remember:   Restoring tissue flexibility helps normal motion to return to the joints. This allows healthier, less painful movement and activity.  An effective stretch should be held for at least 30 seconds.  A stretch should never be painful. You should only feel a gentle lengthening or release in the stretched tissue. FLEXION RANGE OF MOTION AND STRETCHING EXERCISES: STRETCH - Flexion, Single Knee to Chest  Lie on a firm bed or floor with both legs extended in front of you.  Keeping one leg in  contact with the floor, bring your opposite knee to your chest. Hold your leg in place by either grabbing behind your thigh or at your knee.  Pull until you feel a gentle stretch in your low back. Hold __________ seconds. Slowly release your grasp and repeat the exercise with the opposite side. Repeat __________ times. Complete this exercise __________ times per day.  STRETCH - Flexion, Double Knee to Chest  Lie on a firm bed or floor with both legs extended in front of you.  Keeping one leg in contact with the floor, bring  your opposite knee to your chest.  Tense your stomach muscles to support your back and then lift your other knee to your chest. Hold your legs in place by either grabbing behind your thighs or at your knees.  Pull both knees toward your chest until you feel a gentle stretch in your low back. Hold __________ seconds.  Tense your stomach muscles and slowly return one leg at a time to the floor. Repeat __________ times. Complete this exercise __________ times per day.  STRENGTHENING EXERCISES - Spondylolisthesis These exercises may help you when beginning to rehabilitate your injury. These exercises should be done near your "sweet spot." This is the neutral, low-back arch, somewhere between fully rounded and fully arched, that is your least painful position. When performed in this safe range of motion, these exercises can be used for people who have either a flexion or extension based injury. These exercises may resolve your symptoms with or without further involvement from your physician, physical therapist or athletic trainer. While completing these exercises, remember:   Muscles can gain both the endurance and the strength needed for everyday activities through controlled exercises.  Complete these exercises as instructed by your physician, physical therapist or athletic trainer. Progress the resistance and repetitions only as guided.  You may experience muscle soreness or fatigue, but the pain or discomfort you are trying to eliminate should never worsen during these exercises. If this pain does worsen, stop and make certain you are following the directions exactly. If the pain is still present after adjustments, discontinue the exercise until you can discuss the trouble with your clinician. STRENGTHENING - Deep Abdominals, Pelvic Tilt   Lie on a firm bed or floor. Keeping your legs in front of you, bend your knees so they are both pointed toward the ceiling and your feet are flat on the  floor.  Tense your lower abdominal muscles to press your low back into the floor. This motion will rotate your pelvis so that your tail bone is scooping upwards rather than pointing at your feet or into the floor.  With a gentle tension and even breathing, hold this position for __________ seconds. Repeat __________ times. Complete this exercise __________ times per day.  STRENGTHENING - Abdominals, Crunches   Lie on a firm bed or floor. Keeping your legs in front of you, bend your knees so they are both pointed toward the ceiling and your feet are flat on the floor. Cross your arms over your chest.  Slightly tip your chin down without bending your neck.  Tense your abdominals and slowly lift your trunk high enough to just clear your shoulder blades. Lifting higher can put excessive stress on the low back and does not further strengthen your abdominal muscles.  Control your return to the starting position. Repeat __________ times. Complete this exercise __________ times per day.  STRENGTHENING - Quadruped, Opposite UE/LE Lift   Assume a hands and  knees position on a firm surface. Keep your hands under your shoulders and your knees under your hips. You may place padding under your knees for comfort.  Find your neutral spine and gently tense your abdominal muscles so that you can maintain this position. Your shoulders and hips should form a rectangle that is parallel with the floor and is not twisted.  Keeping your trunk steady, lift your right hand no higher than your shoulder and then your left leg no higher than your hip. Make sure you are not holding your breath. Hold this position __________ seconds.  Continuing to keep your abdominal muscles tense and your back steady, slowly return to your starting position. Repeat with the opposite arm and leg. Repeat __________ times. Complete this exercise __________ times per day.  STRENGTHENING - Lower Abdominals, Double Knee Lift  Lie on a firm  bed or floor. Keeping your legs in front of you, bend your knees so they are both pointed toward the ceiling and your feet are flat on the floor.  Tense your abdominal muscles to brace your low back and slowly lift both of your knees until they come over your hips. Be certain not to hold your breath.  Hold __________ seconds. Using your abdominal muscles, return to the starting position in a slow and controlled manner. Repeat __________ times. Complete this exercise __________ times per day.  POSTURE AND BODY MECHANICS CONSIDERATIONS - Spondylolisthesis Keeping correct posture when sitting, standing or completing your activities will reduce the stress put on different body tissues, allowing injured tissues a chance to heal and limiting painful experiences. The following are general guidelines for improved posture. Your physician or physical therapist will provide you with any instructions specific to your needs. While reading these guidelines, remember:  The exercises prescribed by your provider will help you have the flexibility and strength to maintain correct postures.  The correct posture provides the optimal environment for your joints to work. All of your joints have less wear and tear when properly supported by a spine with good posture. This means you will experience a healthier, less painful body.  Correct posture must be practiced with all of your activities, especially prolonged sitting and standing. Correct posture is as important when doing repetitive low-stress activities (typing) as it is when doing a single heavy-load activity (lifting). PROPER SITTING POSTURE In order to minimize stress and discomfort on your spine, you must sit with correct posture. Sitting with good posture should be effortless for a healthy body. Returning to good posture is a gradual process. Many people can work toward this most comfortably by using various supports until they have the flexibility and strength to  maintain this posture on their own. When sitting with proper posture, your ears will fall over your shoulders and your shoulders will fall over your hips. You should use the back of the chair to support your upper back. Your low back will be in a neutral position, just slightly arched. You may place a small pillow or folded towel at the base of your low back for  support.  When working at a desk, create an environment that supports good, upright posture. Without extra support, muscles fatigue and lead to excessive strain on joints and other tissues. Keep these recommendations in mind: CHAIR:  A chair should be able to slide under your desk when your back makes contact with the back of the chair. This allows you to work closely.  The chair's height should allow your eyes  to be level with the upper part of your monitor and your hands to be slightly lower than your elbows. BODY POSITION  Your feet should make contact with the floor. If this is not possible, use a foot rest.  Keep your ears over your shoulders. This will reduce stress on your neck and low back. INCORRECT SITTING POSTURES If you are feeling tired and unable to assume a healthy sitting posture, do not slouch or slump. This puts excessive strain on your back tissues, causing more damage and pain. Healthier options include:  Using more support, like a lumbar pillow.  Switching tasks to something that requires you to be upright or walking.  Taking a brief walk.  Lying down to rest in a neutral-spine position.  CORRECT LIFTING TECHNIQUES DO:   Assume a wide stance. This will provide you more stability and the opportunity to get as close as possible to the object which you are lifting.  Tense your abdominals to brace your spine; then bend at the knees and hips. Keeping your back locked in a neutral-spine position, lift using your leg muscles. Lift with your legs, keeping your back straight.  Test the weight of unknown objects  before attempting to lift them.  Try to keep your elbows locked down at your sides in order get the best strength from your shoulders when carrying an object.  Always ask for help when lifting heavy or awkward objects. INCORRECT LIFTING TECHNIQUES DO NOT:   Lock your knees when lifting, even if it is a small object.  Bend and twist. Pivot at your feet or move your feet when needing to change directions.  Assume that you cannot safely pick up a paperclip without proper posture.   This information is not intended to replace advice given to you by your health care provider. Make sure you discuss any questions you have with your health care provider.   Document Released: 08/28/2005 Document Revised: 05/19/2015 Document Reviewed: 12/10/2008 Elsevier Interactive Patient Education [employer].

## 2016-05-30 NOTE — Progress Notes (Signed)
Spencer Jackson is a 35 y.o. male who presents to [facility] today for establish care well exam.  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 Overall he is doing quite well. He does not take any medications. No fevers chills nausea vomiting or diarrhea. He works as a Financial planner here in town.   Shoulder pain: Left worse than right pain with weight lifting. Mild. 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 He occasionally does some exercise program which helps. Symptoms are mild.  Mood: Patient has mild decreased mood. He's interested in a more natural approach and would like to take tryptophan. He is interested in cognitive behavioral therapy.   Past Medical History:  Diagnosis Date  . Dizziness   . Hypertension   . PAC (premature atrial contraction)   . Palpitations    Past Surgical History:  Procedure Laterality Date  . back cyst resection     Social History  Substance Use Topics  . Smoking status: Former Smoker    Quit date: 11/25/2005  . Smokeless tobacco: Never Used  . Alcohol use 5.4 oz/week    9 Cans of beer per week   family history includes Hepatitis in his mother.  ROS as above:No headache, visual changes, nausea, vomiting, diarrhea, constipation, dizziness, abdominal pain, skin rash, fevers, chills, night sweats, weight loss, swollen lymph nodes, body aches, joint swelling, muscle aches, chest pain, shortness of breath, other mood changes, visual or auditory hallucinations.    Medications: No current outpatient prescriptions on file.   No current facility-administered medications for this visit.    No Known Allergies   Exam:  BP 130/86   Pulse (!) 50    Wt 233 lb (105.7 kg)   BMI 32.50 kg/m  Gen: Well NAD HEENT: EOMI,  MMM Lungs: Normal work of breathing. CTABL Heart: RRR no MRG Abd: NABS, Soft. Nondistended, Nontender Exts: Brisk capillary refill, warm and well perfused.  Shoulders bilaterally normal. Normal motion mildly tender left acromioclavicular joint. Positive crossover arm compression test and positive O'Brien's test otherwise all shoulder exam maneuvers are negative. Strength is intact and equal bilaterally.  psych: Alert and oriented normal process and affect. No SI or HI expressed.  Depression screen PHQ 2/9 05/30/2016  Decreased Interest 2  Down, Depressed, Hopeless 1  PHQ - 2 Score 3  Altered sleeping 0  Tired, decreased energy 2  Change in appetite 2  Feeling bad or failure about yourself  0  Trouble concentrating 1  Moving slowly or fidgety/restless 2  Suicidal thoughts 0  PHQ-9 Score 10  Difficult doing work/chores Very difficult    GAD 7 : Generalized Anxiety Score 05/30/2016  Nervous, Anxious, on Edge 1  Control/stop worrying 0  Worry too much - different things 0  Trouble relaxing 0  Restless 2  Easily annoyed or irritable 2  Afraid - awful might happen 0  Total GAD 7 Score 5  Anxiety Difficulty Somewhat difficult       No results found for this or any previous visit (from the past 24 hour(s)). No results found.    Assessment and Plan: 35 y.o. male with   Well adult: Doing reasonably well with only mild issues identified today. Will obtain basic fasting labs administer Tdap and influenza vaccine.  Mood: Mild depression symptoms.  Plan for counseling and tryptophan. Recheck in 6 months or sooner if needed.  Musculoskeletal: Discussed strategies and plan for shoulder impingement and spinal listhesis bilaterally. Plan for home exercise program.   Orders Placed This Encounter  Procedures  . Tdap vaccine greater than or equal to 7yo IM  . Flu Vaccine QUAD 36+ mos PF IM (Fluarix & Fluzone Quad  PF)  . CBC  . Comprehensive metabolic panel    Order Specific Question:   Has the patient fasted?    Answer:   No  . Hemoglobin A1c  . Lipid panel    Order Specific Question:   Has the patient fasted?    Answer:   No  . HIV antibody  . TSH  . VITAMIN D 25 Hydroxy (Vit-D Deficiency, Fractures)  . Ambulatory referral to Psychology    Referral Priority:   Routine    Referral Type:   Psychiatric    Referral Reason:   Specialty Services Required    Requested Specialty:   Psychology    Number of Visits Requested:   1    Discussed warning signs or symptoms. Please see discharge instructions. Patient expresses understanding.

## 2016-05-31 LAB — HEMOGLOBIN A1C
Hgb A1c MFr Bld: 4.8 % (ref <5.7)
Mean Plasma Glucose: 91 mg/dL

## 2016-05-31 LAB — HIV ANTIBODY (ROUTINE TESTING W REFLEX): HIV 1&2 Ab, 4th Generation: NONREACTIVE

## 2016-05-31 LAB — VITAMIN D 25 HYDROXY (VIT D DEFICIENCY, FRACTURES): VIT D 25 HYDROXY: 30 ng/mL (ref 30–100)

## 2016-07-04 ENCOUNTER — Ambulatory Visit (INDEPENDENT_AMBULATORY_CARE_PROVIDER_SITE_OTHER): Payer: 59 | Admitting: Licensed Clinical Social Worker

## 2016-07-04 DIAGNOSIS — F329 Major depressive disorder, single episode, unspecified: Secondary | ICD-10-CM

## 2016-07-04 DIAGNOSIS — F32A Depression, unspecified: Secondary | ICD-10-CM

## 2016-07-05 ENCOUNTER — Encounter (HOSPITAL_COMMUNITY): Payer: Self-pay | Admitting: Licensed Clinical Social Worker

## 2016-07-05 NOTE — Progress Notes (Signed)
Comprehensive Clinical Assessment (CCA) Note  07/05/2016 Tyr Pearce [id number]  Visit Diagnosis:      ICD-9-CM ICD-10-CM   1. Depression, unspecified depression type 311 F32.9       CCA Part One  Part One has been completed on paper by the patient.  (See scanned document in Chart Review)  CCA Part Two A  Intake/Chief Complaint:  CCA Intake With Chief Complaint CCA Part Two Date: 07/04/16 CCA Part Two Time: 1303 Chief Complaint/Presenting Problem: Referred by PCP  Excessive irritability  Patients Currently Reported Symptoms/Problems:  "It's hard to say what triggers it." When he is in a bad mood he is more apt to snap at his wife or kids.  Other times he shuts down, doesn't communicate about what is going on with him.  Having trouble coping with his 35 year old's defiant behavior.  Once anger is triggered it hard for him to let it go.   Individual's Strengths: I can make people laugh.  Usually silly with his kids.   Individual's Preferences: Wants to stop being triggered so easily, to be able to let go of upsetting situations, communicate more effectively with his wife and kids Type of Services Patient Feels Are Needed: therapy Initial Clinical Notes/Concerns: No previous therapy  Mental Health Symptoms Depression:  Depression: Irritability, Increase/decrease in appetite, Hopelessness, Fatigue, Difficulty Concentrating  Mania:  Mania: N/A  Anxiety:   Anxiety: Irritability  Psychosis:  Psychosis: N/A  Trauma:  Trauma: N/A  Obsessions:  Obsessions: N/A  Compulsions:  Compulsions: N/A  Inattention:  Inattention: N/A  Hyperactivity/Impulsivity:  Hyperactivity/Impulsivity: N/A  Oppositional/Defiant Behaviors:  Oppositional/Defiant Behaviors: Easily annoyed  Borderline Personality:  Emotional Irregularity: N/A  Other Mood/Personality Symptoms:      Mental Status Exam Appearance and self-care  Stature:  Stature: Average  Weight:  Weight: Overweight  Clothing:  Clothing: Casual   Grooming:  Grooming: Normal  Cosmetic use:  Cosmetic Use: None  Posture/gait:  Posture/Gait: Normal  Motor activity:  Motor Activity: Not Remarkable  Sensorium  Attention:  Attention: Normal  Concentration:  Concentration: Normal  Orientation:  Orientation: X5  Recall/memory:  Recall/Memory: Normal  Affect and Mood  Affect:  Affect: Anxious  Mood:  Mood: Anxious  Relating  Eye contact:  Eye Contact: Normal  Facial expression:  Facial Expression: Anxious  Attitude toward examiner:  Attitude Toward Examiner: Cooperative  Thought and Language  Speech flow: Speech Flow: Normal  Thought content:  Thought Content: Appropriate to mood and circumstances  Preoccupation:     Hallucinations:     Organization:     Transport planner of Knowledge:  Fund of Knowledge: Average  Intelligence:  Intelligence: Average  Abstraction:  Abstraction: Normal  Judgement:  Judgement: Normal  Reality Testing:  Reality Testing: Adequate  Insight:  Insight: Fair  Decision Making:  Decision Making: Impulsive  Social Functioning  Social Maturity:  Social Maturity:  (I'm not really that much of a social person.")  Social Judgement:  Social Judgement: Normal  Stress  Stressors:  Stressors: Family conflict, Transitions (Moved to [place] from [place] about a year ago)  Coping Ability:  Coping Ability: Research officer, political party Deficits:     Supports:      Family and Psychosocial History: Family history Marital status: Married Number of Years Married: 72 What types of issues is patient dealing with in the relationship?: His wife, Carmelina Paddock tells him he closes up a lot.  He does so because he wants to avoid conflict.    Additional relationship  information: "We usually get along pretty well."  English is her second language.  She is [place].  She is a stay at home mom.   Does patient have children?: Yes How many children?: 3 How is patient's relationship with their children?: Dorothea Ogle (5) He is in kindergarten.  Barnabas Lister  (4) and Abby (2)  Childhood History:  Childhood History By whom was/is the patient raised?: Both parents Additional childhood history information: Grew up in [place], [place] Description of patient's relationship with caregiver when they were a child: "I was definitely closer to my mom than my dad."  Dad worked a lot. Patient's description of current relationship with people who raised him/her: "There is not much of a relationship."  "We text and they do Facetime with my kids." Does patient have siblings?: Yes Number of Siblings: 2 Description of patient's current relationship with siblings: Adam (33) and Lisa (31)  "I text with my brother once or twice a week."  They live in [place]. 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 How long has patient been employed?: Will be a year in December Patient's job has been impacted by current illness: No Has patient ever been in the [employer]?: No Are There Guns or Other Weapons in [place]?: No  Education: Education Did Physicist, medical?: Yes What Type of College Degree Do you Have?: BA in Astronomer back to school for  [employer] Did You Have An Individualized Education Program (IIEP): No Did You Have Any Difficulty At [employer]?: No  Religion: Religion/Spirituality Are You A Religious Person?: No  Leisure/Recreation: Leisure / Recreation Leisure and Hobbies: Helps care for his kids, watch movies, go on the computer, "I  started getting into woodworking and gardening."  "We've been going to [employer] a lot."  "I'm trying to get into guitar"  Exercise/Diet: Exercise/Diet Do You Exercise?: Yes How Many Times a Week Do You Exercise?: 1-3 times a week Have You Gained or Lost A Significant Amount of Weight in the Past Six Months?: No Do You Follow a Special Diet?: No Do You Have Any Trouble Sleeping?: No  CCA Part Two C  Alcohol/Drug Use: Alcohol / Drug Use History of alcohol / drug use?:  (Currently drinks beer or wine 4-5 times per week.  Typically 4 beers or glasses of wine.  Admits "I used to be a really heavy drinker about 5 years ago.  It was affecting my relationship with my wife so I cut down a lot")                      CCA Part Three  ASAM's:  Six Dimensions of Multidimensional Assessment  Dimension 1:  Acute Intoxication and/or Withdrawal Potential:     Dimension 2:  Biomedical Conditions and Complications:     Dimension 3:  Emotional, Behavioral, or Cognitive Conditions and Complications:     Dimension 4:  Readiness to Change:     Dimension 5:  Relapse, Continued use, or Continued Problem Potential:     Dimension 6:  Recovery/Living Environment:      Substance use Disorder (SUD)    Social Function:  Social Functioning Social Maturity:  (I'm not really that much of a social person.") Social Judgement: Normal  Stress:  Stress Stressors: Family conflict, Transitions (Moved to [place] from [place] about a year ago) Coping Ability: Exhausted Patient Takes Medications The Way The Doctor Instructed?: NA  Risk Assessment- Self-Harm Potential: Risk Assessment For Self-Harm Potential Thoughts of Self-Harm: No current thoughts Additional Comments for Self-Harm Potential: Denies history of harm to self  Risk Assessment -Dangerous to Others Potential: Risk Assessment For Dangerous to Others Potential Method: No Plan Additional Comments for Danger to Others Potential: Denies history of  harm to others  DSM5 Diagnoses: Patient Active Problem List   Diagnosis Date Noted  . Shoulder impingement 05/30/2016  . 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 Depression 05/30/2016  . PAC (premature atrial contraction)       Recommendations for Services/Supports/Treatments: Recommendations for Services/Supports/Treatments Recommendations For Services/Supports/Treatments: Individual Therapy    Garnette Scheuermann

## 2016-07-19 ENCOUNTER — Ambulatory Visit (INDEPENDENT_AMBULATORY_CARE_PROVIDER_SITE_OTHER): Payer: 59 | Admitting: Licensed Clinical Social Worker

## 2016-07-19 DIAGNOSIS — F32A Depression, unspecified: Secondary | ICD-10-CM

## 2016-07-19 DIAGNOSIS — F329 Major depressive disorder, single episode, unspecified: Secondary | ICD-10-CM | POA: Diagnosis not present

## 2016-07-19 NOTE — Progress Notes (Signed)
   THERAPIST PROGRESS NOTE  Session Time: 3:15pm-4:00pm  Participation Level: Active  Behavioral Response: CasualAlertEuthymic  Type of Therapy: Individual Therapy  Treatment Goals addressed: Developed treatment goals today- cope with frustration more effectively  Interventions: Treatment planning  Suicidal/Homicidal: Denied both  Therapist Interventions: Collaborated with patient to develop his treatment plan.   Explored patient's work life and how it may be a contributing factor for his depressed mood.      Summary: Developed the following treatment goal: Echo will report feeling satisfied with how he is coping with situations that trigger frustration.    Described his current work environment as being draining.  Doesn't like the fact that his coworkers are "all business."  Doesn't like working in a cubicle.  Often thinks about things he would rather be doing, like spending time with his kids.  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  Determined to stick with his current job as he does get satisfaction from his work.      Reported that his frustration level has been less lately.  Did not report any significant concerns.          Plan: Scheduled to return in early December.    Diagnosis: Depression unspecified    Armandina Stammer 07/19/2016

## 2016-08-16 ENCOUNTER — Ambulatory Visit (HOSPITAL_COMMUNITY): Payer: Self-pay | Admitting: Licensed Clinical Social Worker

## 2016-10-25 DIAGNOSIS — D225 Melanocytic nevi of trunk: Secondary | ICD-10-CM | POA: Diagnosis not present

## 2016-10-25 DIAGNOSIS — L239 Allergic contact dermatitis, unspecified cause: Secondary | ICD-10-CM | POA: Diagnosis not present

## 2016-10-25 DIAGNOSIS — D485 Neoplasm of uncertain behavior of skin: Secondary | ICD-10-CM | POA: Diagnosis not present

## 2017-06-04 ENCOUNTER — Encounter: Payer: Self-pay | Admitting: Internal Medicine

## 2017-06-04 ENCOUNTER — Encounter: Payer: Self-pay | Admitting: Family Medicine

## 2017-06-04 ENCOUNTER — Ambulatory Visit (INDEPENDENT_AMBULATORY_CARE_PROVIDER_SITE_OTHER): Payer: 59 | Admitting: Family Medicine

## 2017-06-04 VITALS — BP 123/80 | HR 53 | Ht 71.0 in | Wt 232.0 lb

## 2017-06-04 DIAGNOSIS — R0683 Snoring: Secondary | ICD-10-CM | POA: Diagnosis not present

## 2017-06-04 DIAGNOSIS — Z0000 Encounter for general adult medical examination without abnormal findings: Secondary | ICD-10-CM | POA: Diagnosis not present

## 2017-06-04 DIAGNOSIS — Z23 Encounter for immunization: Secondary | ICD-10-CM | POA: Diagnosis not present

## 2017-06-04 DIAGNOSIS — Z6832 Body mass index (BMI) 32.0-32.9, adult: Secondary | ICD-10-CM

## 2017-06-04 DIAGNOSIS — Z6831 Body mass index (BMI) 31.0-31.9, adult: Secondary | ICD-10-CM | POA: Insufficient documentation

## 2017-06-04 DIAGNOSIS — Z3009 Encounter for other general counseling and advice on contraception: Secondary | ICD-10-CM

## 2017-06-04 DIAGNOSIS — K625 Hemorrhage of anus and rectum: Secondary | ICD-10-CM | POA: Insufficient documentation

## 2017-06-04 LAB — COMPLETE METABOLIC PANEL WITH GFR
AG RATIO: 1.8 (calc) (ref 1.0–2.5)
ALT: 12 U/L (ref 9–46)
AST: 17 U/L (ref 10–40)
Albumin: 4.4 g/dL (ref 3.6–5.1)
Alkaline phosphatase (APISO): 51 U/L (ref 40–115)
BUN: 11 mg/dL (ref 7–25)
CALCIUM: 8.8 mg/dL (ref 8.6–10.3)
CO2: 29 mmol/L (ref 20–32)
CREATININE: 0.9 mg/dL (ref 0.60–1.35)
Chloride: 103 mmol/L (ref 98–110)
GFR, EST AFRICAN AMERICAN: 128 mL/min/{1.73_m2} (ref >=60)
GFR, EST NON AFRICAN AMERICAN: 110 mL/min/{1.73_m2} (ref >=60)
GLOBULIN: 2.4 g/dL (ref 1.9–3.7)
Glucose, Bld: 98 mg/dL (ref 65–99)
POTASSIUM: 4.7 mmol/L (ref 3.5–5.3)
Sodium: 139 mmol/L (ref 135–146)
TOTAL PROTEIN: 6.8 g/dL (ref 6.1–8.1)
Total Bilirubin: 0.5 mg/dL (ref 0.2–1.2)

## 2017-06-04 LAB — LIPID PANEL W/REFLEX DIRECT LDL
CHOL/HDL RATIO: 3 (calc) (ref <5.0)
Cholesterol: 153 mg/dL (ref <200)
HDL: 51 mg/dL (ref >40)
LDL Cholesterol (Calc): 88 mg/dL (calc)
NON-HDL CHOLESTEROL (CALC): 102 mg/dL (ref <130)
TRIGLYCERIDES: 53 mg/dL (ref <150)

## 2017-06-04 LAB — CBC
HEMATOCRIT: 45.2 % (ref 38.5–50.0)
Hemoglobin: 15.5 g/dL (ref 13.2–17.1)
MCH: 29.9 pg (ref 27.0–33.0)
MCHC: 34.3 g/dL (ref 32.0–36.0)
MCV: 87.1 fL (ref 80.0–100.0)
MPV: 9.4 fL (ref 7.5–12.5)
Platelets: 254 10*3/uL (ref 140–400)
RBC: 5.19 10*6/uL (ref 4.20–5.80)
RDW: 12.3 % (ref 11.0–15.0)
WBC: 5.6 10*3/uL (ref 3.8–10.8)

## 2017-06-04 NOTE — Progress Notes (Signed)
Spencer Jackson is a 36 y.o. male who presents to [facility] today for well adult visit.   Spencer Jackson notes several issues.  He would like a referral to urology for vasectomy. Additionally he notes painless rectal bleeding intermittently ongoing for years. He denies any abdominal pain fevers chills nausea vomiting or diarrhea. He notes bright red blood on the toilet paper.  Lastly he notes fatigue. He notes that he snores and has had observed apneic episodes. He is concerning may have sleep apnea.  Overall is doing well however. He tries to exercise some and tries to eat a balanced diet if he can.   Past Medical History:  Diagnosis Date  . Dizziness   . Hypertension   . PAC (premature atrial contraction)   . Palpitations    Past Surgical History:  Procedure Laterality Date  . back cyst resection     Social History  Substance Use Topics  . Smoking status: Former Smoker    Quit date: 11/25/2005  . Smokeless tobacco: Never Used  . Alcohol use 5.4 oz/week    9 Cans of beer per week   family history includes Hepatitis in his mother.  ROS as above:  Medications: No current outpatient prescriptions on file.   No current facility-administered medications for this visit.    No Known Allergies  Health Maintenance Health Maintenance  Topic Date Due  . INFLUENZA VACCINE  04/11/2017  . TETANUS/TDAP  05/30/2026  . HIV Screening  Completed     Exam:  BP 123/80   Pulse (!) 53   Ht 5\' 11"  (1.803 m)   Wt 232 lb (105.2 kg)   BMI 32.36 kg/m  Gen: Well NAD HEENT: EOMI,  MMM Lungs: Normal work of breathing. CTABL Heart: Brady no MRG Abd: NABS, Soft. Nondistended, Nontender Exts: Brisk capillary refill, warm and well perfused.  Skin: Multiple macular hyperpigmented areas on the back and chest non-appear dysplastic. Psych: Alert and oriented normal speech thought  process and affect. Rectal exam: Normal appearing anus no masses palpated. Normal sized prostate. No obvious rectal fissures or hemorrhoids present.  STOP BANG: Snore:     Yes Tired:     Yes Observed stop breathing:  Yes Hypertension:   Yes  BMI >35:   No Age >50:   No Neck > 16 inches:  Yes Male gender:   Yes ------------------------------------------ Total:     6/8   Depression screen [facility] 2/9 06/04/2017 06/04/2017 07/04/2016 05/30/2016  Decreased Interest 0 0 2 2  Down, Depressed, Hopeless 0 0 1 1  PHQ - 2 Score 0 0 3 3  Altered sleeping 2 - 1 0  Tired, decreased energy 2 - 2 2  Change in appetite 1 - 2 2  Feeling bad or failure about yourself  0 - 0 0  Trouble concentrating 0 - 2 1  Moving slowly or fidgety/restless 0 - 0 2  Suicidal thoughts 0 - 0 0  PHQ-9 Score 5 - 10 10  Difficult doing work/chores - - Very difficult Very difficult      No results found for this or any previous visit (from the past 72 hour(s)). No results found.    Assessment and Plan: 36 y.o. male with Well Adult. Doing well. Plan for fasting labs listed below.   Fatigue: We'll check basic fasting labs. TSH last year was normal. I don't think we need to recheck. I think we should focus on sleep  apnea. Plan for home sleep study.  Rectal bleeding: Unclear etiology. Plan to refer to gastroenterology likely for colonoscopy.  Vasectomy: Refer to [facility].  Influenza vaccine given prior to discharge.    Orders Placed This Encounter  Procedures  . Flu Vaccine QUAD 36+ mos IM  . CBC  . COMPLETE METABOLIC PANEL WITH GFR  . Lipid Panel w/reflex Direct LDL  . Ambulatory referral to Urology    Referral Priority:   Routine    Referral Type:   Consultation    Referral Reason:   Specialty Services Required    Requested Specialty:   Urology    Number of Visits Requested:   1  . Ambulatory referral to Gastroenterology    Referral Priority:   Routine    Referral Type:   Consultation     Referral Reason:   Specialty Services Required    Number of Visits Requested:   1  . Home sleep test    Standing Status:   Future    Standing Expiration Date:   06/04/2018    Scheduling Instructions:     [facility]    Order Specific Question:   Where should this test be performed:    Answer:   Other   No orders of the defined types were placed in this encounter.    Discussed warning signs or symptoms. Please see discharge instructions. Patient expresses understanding.

## 2017-06-04 NOTE — Patient Instructions (Addendum)
Thank you for coming in today. You should hear about the sleep study soon.  Let me know if you do not hear anything.   For Vasectomy you should hear from [facility] Soon.   For rectal bleeding you should hear from [facility] soon. Again let me know if you never hear anything.    Sleep Apnea Sleep apnea is a condition in which breathing pauses or becomes shallow during sleep. Episodes of sleep apnea usually last 10 seconds or longer, and they may occur as many as 20 times an hour. Sleep apnea disrupts your sleep and keeps your body from getting the rest that it needs. This condition can increase your risk of certain health problems, including:  Heart attack.  Stroke.  Obesity.  Diabetes.  Heart failure.  Irregular heartbeat.  There are three kinds of sleep apnea:  Obstructive sleep apnea. This kind is caused by a blocked or collapsed airway.  Central sleep apnea. This kind happens when the part of the brain that controls breathing does not send the correct signals to the muscles that control breathing.  Mixed sleep apnea. This is a combination of obstructive and central sleep apnea.  What are the causes? The most common cause of this condition is a collapsed or blocked airway. An airway can collapse or become blocked if:  Your throat muscles are abnormally relaxed.  Your tongue and tonsils are larger than normal.  You are overweight.  Your airway is smaller than normal.  What increases the risk? This condition is more likely to develop in people who:  Are overweight.  Smoke.  Have a smaller than normal airway.  Are elderly.  Are male.  Drink alcohol.  Take sedatives or tranquilizers.  Have a family history of sleep apnea.  What are the signs or symptoms? Symptoms of this condition include:  Trouble staying asleep.  Daytime sleepiness and tiredness.  Irritability.  Loud snoring.  Morning headaches.  Trouble  concentrating.  Forgetfulness.  Decreased interest in sex.  Unexplained sleepiness.  Mood swings.  Personality changes.  Feelings of depression.  Waking up often during the night to urinate.  Dry mouth.  Sore throat.  How is this diagnosed? This condition may be diagnosed with:  A medical history.  A physical exam.  A series of tests that are done while you are sleeping (sleep study). These tests are usually done in a sleep lab, but they may also be done at home.  How is this treated? Treatment for this condition aims to restore normal breathing and to ease symptoms during sleep. It may involve managing health issues that can affect breathing, such as high blood pressure or obesity. Treatment may include:  Sleeping on your side.  Using a decongestant if you have nasal congestion.  Avoiding the use of depressants, including alcohol, sedatives, and narcotics.  Losing weight if you are overweight.  Making changes to your diet.  Quitting smoking.  Using a device to open your airway while you sleep, such as: ? An oral appliance. This is a custom-made mouthpiece that shifts your lower jaw forward. ? A continuous positive airway pressure (CPAP) device. This device delivers oxygen to your airway through a mask. ? A nasal expiratory positive airway pressure (EPAP) device. This device has valves that you put into each nostril. ? A bi-level positive airway pressure (BPAP) device. This device delivers oxygen to your airway through a mask.  Surgery if other treatments do not work. During surgery, excess tissue is removed  to create a wider airway.  It is important to get treatment for sleep apnea. Without treatment, this condition can lead to:  High blood pressure.  Coronary artery disease.  (Men) An inability to achieve or maintain an erection (impotence).  Reduced thinking abilities.  Follow these instructions at home:  Make any lifestyle changes that your health  care provider recommends.  Eat a healthy, well-balanced diet.  Take over-the-counter and prescription medicines only as told by your health care provider.  Avoid using depressants, including alcohol, sedatives, and narcotics.  Take steps to lose weight if you are overweight.  If you were given a device to open your airway while you sleep, use it only as told by your health care provider.  Do not use any tobacco products, such as cigarettes, chewing tobacco, and e-cigarettes. If you need help quitting, ask your health care provider.  Keep all follow-up visits as told by your health care provider. This is important. Contact a health care provider if:  The device that you received to open your airway during sleep is uncomfortable or does not seem to be working.  Your symptoms do not improve.  Your symptoms get worse. Get help right away if:  You develop chest pain.  You develop shortness of breath.  You develop discomfort in your back, arms, or stomach.  You have trouble speaking.  You have weakness on one side of your body.  You have drooping in your face. These symptoms may represent a serious problem that is an emergency. Do not wait to see if the symptoms will go away. Get medical help right away. Call your local emergency services (911 in the [place] Do not drive yourself to the hospital. This information is not intended to replace advice given to you by your health care provider. Make sure you discuss any questions you have with your health care provider. Document Released: 08/18/2002 Document Revised: 04/23/2016 Document Reviewed: 06/07/2015 Elsevier Interactive Patient Education  [employer].

## 2017-06-18 DIAGNOSIS — G4730 Sleep apnea, unspecified: Secondary | ICD-10-CM | POA: Diagnosis not present

## 2017-06-18 DIAGNOSIS — I10 Essential (primary) hypertension: Secondary | ICD-10-CM | POA: Diagnosis not present

## 2017-06-18 DIAGNOSIS — R0683 Snoring: Secondary | ICD-10-CM | POA: Diagnosis not present

## 2017-06-26 ENCOUNTER — Telehealth: Payer: Self-pay | Admitting: Family Medicine

## 2017-06-26 NOTE — Telephone Encounter (Signed)
Sleep Study returned today showing mild sleep apnea. If you are significantly fatigued and not getting better with typical treatment I would recommend using a CPAP.

## 2017-06-26 NOTE — Telephone Encounter (Signed)
Left detailed vm with information. Requested a call back with weather or not he would like to start the CPAP.

## 2017-06-29 ENCOUNTER — Encounter: Payer: Self-pay | Admitting: Family Medicine

## 2017-07-04 ENCOUNTER — Encounter: Payer: Self-pay | Admitting: Family Medicine

## 2017-07-04 DIAGNOSIS — M25512 Pain in left shoulder: Principal | ICD-10-CM

## 2017-07-04 DIAGNOSIS — M25511 Pain in right shoulder: Secondary | ICD-10-CM

## 2017-07-04 DIAGNOSIS — L219 Seborrheic dermatitis, unspecified: Secondary | ICD-10-CM | POA: Diagnosis not present

## 2017-07-04 DIAGNOSIS — D225 Melanocytic nevi of trunk: Secondary | ICD-10-CM | POA: Diagnosis not present

## 2017-07-19 ENCOUNTER — Ambulatory Visit: Payer: Self-pay | Admitting: Physical Therapy

## 2017-07-20 ENCOUNTER — Encounter: Payer: Self-pay | Admitting: Rehabilitative and Restorative Service Providers"

## 2017-07-20 ENCOUNTER — Ambulatory Visit (INDEPENDENT_AMBULATORY_CARE_PROVIDER_SITE_OTHER): Payer: 59 | Admitting: Rehabilitative and Restorative Service Providers"

## 2017-07-20 ENCOUNTER — Other Ambulatory Visit: Payer: Self-pay

## 2017-07-20 DIAGNOSIS — R293 Abnormal posture: Secondary | ICD-10-CM

## 2017-07-20 DIAGNOSIS — R29898 Other symptoms and signs involving the musculoskeletal system: Secondary | ICD-10-CM | POA: Diagnosis not present

## 2017-07-20 DIAGNOSIS — M25512 Pain in left shoulder: Secondary | ICD-10-CM

## 2017-07-20 DIAGNOSIS — M25511 Pain in right shoulder: Secondary | ICD-10-CM | POA: Diagnosis not present

## 2017-07-20 NOTE — Patient Instructions (Signed)
Axial Extension (Chin Tuck)    Pull chin in and lengthen back of neck. Hold __5__ seconds while counting out loud. Repeat __10__ times. Do __several__ sessions per day.  Shoulder Blade Squeeze   Can use noodle along spine  Rotate shoulders back, then squeeze shoulder blades down and back Hold 10 sec Repeat _10___ times. Do _several___ sessions per day.  Upper Back Strength: Lower Trapezius / Rotator Cuff " L's "     Arms in waitress pose, palms up. Press hands back and slide shoulder blades down. Hold for __5__ seconds. Repeat _10___ times. 1-2 times per day.    Scapular Retraction: Elbow Flexion (Standing)  "W's"     With elbows bent to 90, pinch shoulder blades together and rotate arms out, keeping elbows bent. Repeat __10__ times per set. Do __1-2__ sets per session. Do _several ___ sessions per day.  Scapula Adduction With Pectoralis Stretch: Low - Standing   Shoulders at 45 hands even with shoulders, keeping weight through legs, shift weight forward until you feel pull or stretch through the front of your chest. Hold _30__ seconds. Do _3__ times, _2-4__ times per day.   Scapula Adduction With Pectoralis Stretch: Mid-Range - Standing   Shoulders at 90 elbows even with shoulders, keeping weight through legs, shift weight forward until you feel pull or strength through the front of your chest. Hold __30_ seconds. Do _3__ times, __2-4_ times per day.   Scapula Adduction With Pectoralis Stretch: High - Standing   Shoulders at 120 hands up high on the doorway, keeping weight on feet, shift weight forward until you feel pull or stretch through the front of your chest. Hold _30__ seconds. Do _3__ times, _2-3__ times per day.  Self massage using ~3 inch plastic ball  TENS UNIT: This is helpful for muscle pain and spasm.   Search and Purchase a TENS 7000 2nd edition at www.tenspros.com. It should be less than $30.     TENS unit instructions: Do not shower or  bathe with the unit on Turn the unit off before removing electrodes or batteries If the electrodes lose stickiness add a drop of water to the electrodes after they are disconnected from the unit and place on plastic sheet. If you continued to have difficulty, call the TENS unit company to purchase more electrodes. Do not apply lotion on the skin area prior to use. Make sure the skin is clean and dry as this will help prolong the life of the electrodes. After use, always check skin for unusual red areas, rash or other skin difficulties. If there are any skin problems, does not apply electrodes to the same area. Never remove the electrodes from the unit by pulling the wires. Do not use the TENS unit or electrodes other than as directed. Do not change electrode placement without consultating your therapist or physician. Keep 2 fingers with between each electrode.   Trigger Point Dry Needling  . What is Trigger Point Dry Needling (DN)? o DN is a physical therapy technique used to treat muscle pain and dysfunction. Specifically, DN helps deactivate muscle trigger points (muscle knots).  o A thin filiform needle is used to penetrate the skin and stimulate the underlying trigger point. The goal is for a local twitch response (LTR) to occur and for the trigger point to relax. No medication of any kind is injected during the procedure.   . What Does Trigger Point Dry Needling Feel Like?  o The procedure feels different for each individual  patient. Some patients report that they do not actually feel the needle enter the skin and overall the process is not painful. Very mild bleeding may occur. However, many patients feel a deep cramping in the muscle in which the needle was inserted. This is the local twitch response.   Marland Kitchen How Will I feel after the treatment? o Soreness is normal, and the onset of soreness may not occur for a few hours. Typically this soreness does not last longer than two days.   o Bruising is uncommon, however; ice can be used to decrease any possible bruising.  o In rare cases feeling tired or nauseous after the treatment is normal. In addition, your symptoms may get worse before they get better, this period will typically not last longer than 24 hours.   . What Can I do After My Treatment? o Increase your hydration by drinking more water for the next 24 hours. o You may place ice or heat on the areas treated that have become sore, however, do not use heat on inflamed or bruised areas. Heat often brings more relief post needling. o You can continue your regular activities, but vigorous activity is not recommended initially after the treatment for 24 hours. o DN is best combined with other physical therapy such as strengthening, stretching, and other therapies.       [facility] at [facility] [address] [place], [place] [address]  [phone] (office) [phone] (fax)

## 2017-07-20 NOTE — Therapy (Signed)
[place], [place], [address] Phone: [phone]   Fax:  [phone]  Physical Therapy Evaluation  Patient Details  Name: Spencer Jackson MRN: [id number] Date of Birth: 03/16/1981 Referring Provider: Dr Georgina Snell    Encounter Date: 07/20/2017  PT End of Session - 07/20/17 0846    Visit Number  1    Number of Visits  12    Date for PT Re-Evaluation  08/31/17    PT Start Time  0844    PT Stop Time  0941    PT Time Calculation (min)  57 min    Activity Tolerance  Patient tolerated treatment well       Past Medical History:  Diagnosis Date  . Dizziness   . Hypertension   . PAC (premature atrial contraction)   . Palpitations     Past Surgical History:  Procedure Laterality Date  . back cyst resection      There were no vitals filed for this visit.   Subjective Assessment - 07/20/17 0853    Subjective  Patient reports bilat shoudler pain since he was 36 years old. He has noticed increased symptoms in the past month with returning to working out at the gym. He has pain with certain motions and awakens with pain when he sleeps with head resting on his arm.     Pertinent History  spondylosis; LBP x 12 yrs     How long can you sit comfortably?  no limit    How long can you stand comfortably?  no limit    How long can you walk comfortably?  no limit    Diagnostic tests  xrays    Patient Stated Goals  decrease pain and be able to work out "normally"     Currently in Pain?  Yes    Pain Location  Shoulder    Pain Orientation  Right;Left    Pain Descriptors / Indicators  Sharp    Pain Type  Chronic pain;Acute pain    Pain Onset  More than a month ago    Pain Frequency  Intermittent    Aggravating Factors   working out; sleeping head resting on arm     Pain Relieving Factors  moving shoulder in certain way          [facility] PT Assessment - 07/20/17 0001      Assessment   Medical Diagnosis  Bilat  shoulder pain     Referring Provider  Dr Georgina Snell     Onset Date/Surgical Date  06/25/17 symptoms present in past 12 years     Hand Dominance  Right    Next MD Visit  PRN     Prior Therapy  years ago for shoulders       Precautions   Precautions  None      Balance Screen   Has the patient fallen in the past 6 months  No    Has the patient had a decrease in activity level because of a fear of falling?   No    Is the patient reluctant to leave their home because of a fear of falling?   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     Leisure  yard work; household chores; woodworking; playing with kids; working out at home -  body weight exercises; punching bag; stationary bike; kettle bells      Observation/Other Assessments   Focus on Therapeutic Outcomes (FOTO)   41% limitation       Posture/Postural Control   Posture Comments  head forward; shoulderrs rounded and elevated; scapulae abducted and rotated along the thoracic wall; head of the humerus anterior in orientation       AROM   Right Shoulder Extension  61 Degrees    Right Shoulder Flexion  155 Degrees    Right Shoulder ABduction  163 Degrees    Right Shoulder Internal Rotation  25 Degrees    Right Shoulder External Rotation  90 Degrees    Left Shoulder Extension  59 Degrees    Left Shoulder Flexion  157 Degrees    Left Shoulder ABduction  163 Degrees    Left Shoulder Internal Rotation  29 Degrees    Left Shoulder External Rotation  95 Degrees      Strength   Overall Strength Comments  5/5 bilat UE's discomfort with lower trap Rt       Palpation   Spinal mobility  tight and tender with CPA mobs upper to mid thoracic spine     Palpation comment  tight Rt > Lt pecs; upper trap; leveator; biceps              Objective measurements completed on examination: See above findings.      [facility]  PT Treatment/Exercise - 07/20/17 0001      Self-Care   Self-Care  -- education re sitting posture/use of noodle thoracic spine      Therapeutic Activites    Therapeutic Activities  -- myofacial ball release work       Neuro Re-ed    Neuro Re-ed Details   working on posture and alignment engaging posterior shoudler girdle      Shoulder Exercises: Standing   Other Standing Exercises  scap squeeze with noodle 10 sec x 10; axial extension 10 sec x 5; L's x 10' W's x 10       Shoulder Exercises: Stretch   Other Shoulder Stretches  3 way doorway stretch 20-30 sec x 2 each     Other Shoulder Stretches  prolonged snow angel on green noodle ~ 1 min arms ~ 80 deg       Moist Heat Therapy   Number Minutes Moist Heat  20 Minutes    Moist Heat Location  Shoulder bilat       Electrical Stimulation   Electrical Stimulation Location  bilat anterior shoulder/pecs     Electrical Stimulation Action  HVG    Electrical Stimulation Parameters  to tolerance    Electrical Stimulation Goals  Tone;Pain             PT Education - 07/20/17 0924    Education provided  Yes    Education Details  HEP; ball release work; TENS; DN     Person(s) Educated  Patient    Methods  Explanation;Demonstration;Tactile cues;Verbal cues;Handout    Comprehension  Verbalized understanding;Returned demonstration;Verbal cues required          PT Long Term Goals - 07/20/17 0950      PT LONG TERM GOAL #1   Title  Improve posture and alignment with patient to demonstrate improved upright posture with posterior shoulder girdle musculature engaged 08/31/17    Time  6    Period  Weeks    Status  New      PT LONG  TERM GOAL #2   Title  Increase bilat shoulder elevation by 5-10 deg and increase pec extensibilty 08/31/17    Time  6    Period  Weeks    Status  New      PT LONG TERM GOAL #3   Title  Decrease shoulder pain with patient to preform normal functional activites and sleep with minimal to no shoulder pain  08/31/17    Time  6    Period  Weeks    Status  New      PT LONG TERM GOAL #4   Title  Independent in HEP with appropriate home gym program to stretch anterior shoudler musculature and strengthening posterior shoudler girdle musculature 08/31/17    Time  6    Period  Weeks    Status  New      PT LONG TERM GOAL #5   Title  Improve FOTO to </= 26% limitation 08/31/17    Time  6    Period  Weeks    Status  New             Plan - 07/20/17 0945    Clinical Impression Statement  Spencer Jackson presents with acute flare up of bilat chronic shoulder pain which has been present for ~ 18 years. Spencer Jackson has pain with certain movements and awakens with pain when he rests head on arm.  He has poor posture and alignment; limited shoulder ROM; muscular tightness through the thoracic spine and shoulder girdle; pain on a dialy basis. Spencer Jackson will benefit from PT to address problems identified.     Clinical Presentation due to:  chronic postural changes; muscular imbalance; pain in bilat shoulders     Clinical Decision Making  Low    Rehab Potential  Good    Clinical Impairments Affecting Rehab Potential  chronic nature of symptoms     PT Frequency  2x / week    PT Duration  6 weeks    PT Treatment/Interventions  ADLs/Self Care Home Management;Cryotherapy;Electrical Stimulation;Iontophoresis 4mg /ml Dexamethasone;Moist Heat;Ultrasound;Patient/family education;Neuromuscular re-education;Dry needling;Manual techniques;Therapeutic exercise;Therapeutic activities    PT Next Visit Plan  review HEP; continue postural correction; stretch pecs; manual work through the [employer]; progress to posterior shoulder girdle strengthening; modalities as indicated     Consulted and Agree with Plan of Care  Patient       Patient will benefit from skilled therapeutic intervention in order to improve the following deficits and impairments:  Postural dysfunction, Improper body mechanics, Increased fascial restricitons, Increased  muscle spasms, Decreased mobility, Pain  Visit Diagnosis: Acute pain of right shoulder - Plan: PT plan of care cert/re-cert  Acute pain of left shoulder - Plan: PT plan of care cert/re-cert  Other symptoms and signs involving the musculoskeletal system - Plan: PT plan of care cert/re-cert  Abnormal posture - Plan: PT plan of care cert/re-cert     Problem List Patient Active Problem List   Diagnosis Date Noted  . Snores 06/04/2017  . Rectal bleeding 06/04/2017  . BMI 32.0-32.9,adult 06/04/2017  . Shoulder impingement 05/30/2016  . 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 PAC (premature atrial contraction)     Na Waldrip Nilda Simmer PT, MPH  07/20/2017, 9:57 AM  [facility] [place], [place], [address] Phone: [phone]   Fax:  [phone]  Name: Talley Kreiser MRN: [id number] Date of Birth: 26-Jun-1981

## 2017-07-25 ENCOUNTER — Ambulatory Visit: Payer: 59 | Admitting: Rehabilitative and Restorative Service Providers"

## 2017-07-25 ENCOUNTER — Other Ambulatory Visit: Payer: Self-pay

## 2017-07-25 ENCOUNTER — Encounter: Payer: Self-pay | Admitting: Rehabilitative and Restorative Service Providers"

## 2017-07-25 DIAGNOSIS — M25512 Pain in left shoulder: Secondary | ICD-10-CM | POA: Diagnosis not present

## 2017-07-25 DIAGNOSIS — R293 Abnormal posture: Secondary | ICD-10-CM

## 2017-07-25 DIAGNOSIS — R29898 Other symptoms and signs involving the musculoskeletal system: Secondary | ICD-10-CM | POA: Diagnosis not present

## 2017-07-25 DIAGNOSIS — M25511 Pain in right shoulder: Secondary | ICD-10-CM | POA: Diagnosis not present

## 2017-07-25 NOTE — Patient Instructions (Signed)
Resisted External Rotation: in Neutral - Bilateral   PALMS UP Sit or stand, tubing in both hands, elbows at sides, bent to 90, forearms forward. Pinch shoulder blades together and rotate forearms out. Keep elbows at sides. Repeat __10__ times per set. Do _2-3___ sets per session. Do _2-3___ sessions per day.   stepping under large ball or doorway  Hold 30 sec  3 reps 2-3 x/day

## 2017-07-25 NOTE — Therapy (Signed)
[place], [place], [address] Phone: [phone]   Fax:  [phone]  Physical Therapy Treatment  Patient Details  Name: Spencer Jackson MRN: [id number] Date of Birth: 21-Aug-1981 Referring Provider: Dr Georgina Snell    Encounter Date: 07/25/2017  PT End of Session - 07/25/17 1604    Visit Number  2    Number of Visits  12    Date for PT Re-Evaluation  08/31/17    PT Start Time  1602    PT Stop Time  1649    PT Time Calculation (min)  47 min    Activity Tolerance  Patient tolerated treatment well       Past Medical History:  Diagnosis Date  . Dizziness   . Hypertension   . PAC (premature atrial contraction)   . Palpitations     Past Surgical History:  Procedure Laterality Date  . back cyst resection      There were no vitals filed for this visit.  Subjective Assessment - 07/25/17 1605    Subjective  Upper back is a little sore from doing the shoulder squeezes - which he did a lot cause he can do that one at his desk. Did the others less frequently. No change in the shoulder symptoms.     Currently in Pain?  Yes    Pain Score  4     Pain Location  Shoulder    Pain Orientation  Right;Left    Pain Descriptors / Indicators  Sharp;Shooting with certain movements     Pain Type  Chronic pain;Acute pain    Pain Onset  More than a month ago    Pain Frequency  Intermittent                      [facility] Adult PT Treatment/Exercise - 07/25/17 0001      Neuro Re-ed    Neuro Re-ed Details   working on posture and alignment engaging posterior shoudler girdle      Shoulder Exercises: Standing   Retraction  Strengthening;Both;20 reps;Theraband    Theraband Level (Shoulder Retraction)  Level 2 (Red)    Other Standing Exercises  scap squeeze with noodle 10 sec x 10; axial extension 10 sec x 5; L's x 10' W's x 10       Shoulder Exercises: Pulleys   Flexion  -- 10 sec hold x 5    ABduction  --  10 sec hold x 5     Other Pulley Exercises  scaption       Shoulder Exercises: Therapy Ball   Other Therapy Ball Exercises  shoulder flexion stretch steppin g under large ball 30 sec x 3; through doorway 30 sec x 1       Shoulder Exercises: Stretch   Other Shoulder Stretches  3 way doorway stretch 20-30 sec x 2 each     Other Shoulder Stretches  prolonged snow angel on green noodle ~ 1 min arms ~ 80 deg       Manual Therapy   Manual therapy comments  pt supine     Soft tissue mobilization  dep tissue work through [place]     Myofascial Release  pecs     Passive ROM  PROM shoulder ER at 90 deg abd              PT Education - 07/25/17 1636    Education provided  Yes  (Pended)  Education Details  HEP   (Pended)     Person(s) Educated  Patient  (Pended)     Methods  Explanation;Demonstration;Tactile cues;Verbal cues;Handout  (Pended)     Comprehension  Verbalized understanding;Returned demonstration;Verbal cues required;Tactile cues required  (Pended)           PT Long Term Goals - 07/25/17 1703      PT LONG TERM GOAL #1   Title  Improve posture and alignment with patient to demonstrate improved upright posture with posterior shoulder girdle musculature engaged 08/31/17    Time  6    Period  Weeks    Status  On-going      PT LONG TERM GOAL #2   Title  Increase bilat shoulder elevation by 5-10 deg and increase pec extensibilty 08/31/17    Time  6    Period  Weeks    Status  On-going      PT LONG TERM GOAL #3   Title  Decrease shoulder pain with patient to preform normal functional activites and sleep with minimal to no shoulder pain 08/31/17    Time  6    Period  Weeks    Status  On-going      PT LONG TERM GOAL #4   Title  Independent in HEP with appropriate home gym program to stretch anterior shoudler musculature and strengthening posterior shoudler girdle musculature 08/31/17    Time  6    Period  Weeks    Status  On-going      PT LONG TERM GOAL #5    Title  Improve FOTO to </= 26% limitation 08/31/17    Time  6    Period  Weeks    Status  On-going            Plan - 07/25/17 1656    Clinical Impression Statement  Continued intermittent tightness bilat shoulders with certain movements - (which seems to be related to return from frd posture of shds) Patient needs to focus on stretching pecs. He is gaining postural control with decresaed associated movement in upper traps with chest lift/postural correction engaging posterior shoudler girdle musculature. Some increase in mobility through the pecs with exercise and manual work. Has just moved which likely increased tightness. Discussed exercise to avoid with work out. substituted reverse wall push up for push up and will focus on lower body avoiding chest or overhead press. Discussed DN and pt does not want to try at this time.     Rehab Potential  Good    PT Frequency  2x / week    PT Treatment/Interventions  ADLs/Self Care Home Management;Cryotherapy;Electrical Stimulation;Iontophoresis 4mg /ml Dexamethasone;Moist Heat;Ultrasound;Patient/family education;Neuromuscular re-education;Dry needling;Manual techniques;Therapeutic exercise;Therapeutic activities    PT Next Visit Plan  review HEP; continue postural correction; stretch pecs; manual work through the [employer]; progress to posterior shoulder girdle strengthening; modalities as indicated add prone strengthening for posterior shoulder girdle and biceps stretch next visit.     Consulted and Agree with Plan of Care  Patient       Patient will benefit from skilled therapeutic intervention in order to improve the following deficits and impairments:  Postural dysfunction, Improper body mechanics, Increased fascial restricitons, Increased muscle spasms, Decreased mobility, Pain  Visit Diagnosis: Acute pain of right shoulder  Acute pain of left shoulder  Other symptoms and signs involving the musculoskeletal system  Abnormal  posture     Problem List Patient Active Problem List   Diagnosis Date Noted  . Snores 06/04/2017  .  Rectal bleeding 06/04/2017  . BMI 32.0-32.9,adult 06/04/2017  . Shoulder impingement 05/30/2016  . 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 PAC (premature atrial contraction)     Rayette Mogg Nilda Simmer PT, MPH  07/25/2017, 5:05 PM  [facility] [place], [place], [address] Phone: [phone]   Fax:  [phone]  Name: Akim Watkinson MRN: [id number] Date of Birth: [birth year]

## 2017-07-26 ENCOUNTER — Encounter: Payer: Self-pay | Admitting: Internal Medicine

## 2017-07-26 ENCOUNTER — Ambulatory Visit (INDEPENDENT_AMBULATORY_CARE_PROVIDER_SITE_OTHER): Payer: 59 | Admitting: Internal Medicine

## 2017-07-26 VITALS — BP 120/80 | HR 56 | Ht 71.0 in | Wt 232.0 lb

## 2017-07-26 DIAGNOSIS — K625 Hemorrhage of anus and rectum: Secondary | ICD-10-CM | POA: Diagnosis not present

## 2017-07-26 MED ORDER — NA SULFATE-K SULFATE-MG SULF 17.5-3.13-1.6 GM/177ML PO SOLN: 1.0000 | Freq: Once | ORAL | 0 refills | Status: AC

## 2017-07-26 NOTE — Progress Notes (Signed)
HISTORY OF PRESENT ILLNESS:  Spencer Jackson is a 36 y.o. male , Armed forces training and education officer for [employer], who is referred by his primary care provider Dr. Georgina Snell with chief complaint of rectal bleeding. The patient reports a 2 year history of intermittent rectal bleeding. Generally manifested by bright red blood on the tissue. Infrequently associated with rectal discomfort. Typically has one to 2 bowel movements per day. He denies abdominal pain or weight loss. No family history of colon cancer. Office evaluation with Dr. Tommi Rumps reviewed. Rectal examination at that time (06/04/2017) was normal. No cause for bleeding found. Review of outside blood work from that time was unremarkable including a hemoglobin of 15.5.  REVIEW OF SYSTEMS:  All non-GI ROS negative unless otherwise stated in the history of present illness  Past Medical History:  Diagnosis Date  . Dizziness   . Hypertension   . PAC (premature atrial contraction)   . Palpitations     Past Surgical History:  Procedure Laterality Date  . back cyst resection      Social History Donzel Romack  reports that he quit smoking about 11 years ago. he has never used smokeless tobacco. He reports that he drinks about 5.4 oz of alcohol per week. He reports that he does not use drugs.  family history includes Hepatitis in his mother.  No Known Allergies     PHYSICAL EXAMINATION: Vital signs: BP 120/80   Pulse (!) 56   Ht 5\' 11"  (1.803 m)   Wt 232 lb (105.2 kg)   BMI 32.36 kg/m   Constitutional: generally well-appearing, no acute distress Psychiatric: alert and oriented x3, cooperative Eyes: extraocular movements intact, anicteric, conjunctiva pink Mouth: oral pharynx moist, no lesions Neck: supple no lymphadenopathy Cardiovascular: heart regular rate and rhythm, no murmur Lungs: clear to auscultation bilaterally Abdomen: soft, obese, nontender, nondistended, no obvious ascites, no peritoneal signs, normal bowel sounds, no  organomegaly Rectal: Deferred until colonoscopy Extremities: no clubbing, cyanosis, or lower extremity edema bilaterally Skin: no lesions on visible extremities Neuro: No focal deficits. Cranial nerves intact  ASSESSMENT:  #1. Intermittent rectal bleeding. Normal hemoglobin. Normal recent rectal exam. Rule out internal hemorrhoids. Rule out polypoid lesions. Rule out neoplasia   PLAN:  #1. Colonoscopy.The nature of the procedure, as well as the risks, benefits, and alternatives were carefully and thoroughly reviewed with the patient. Ample time for discussion and questions allowed. The patient understood, was satisfied, and agreed to proceed.   A copy of this consultation note has been sent to Dr. Georgina Snell

## 2017-07-26 NOTE — Patient Instructions (Signed)

## 2017-07-31 ENCOUNTER — Encounter: Payer: Self-pay | Admitting: Rehabilitative and Restorative Service Providers"

## 2017-08-08 ENCOUNTER — Encounter: Payer: Self-pay | Admitting: Rehabilitative and Restorative Service Providers"

## 2017-08-08 ENCOUNTER — Ambulatory Visit (INDEPENDENT_AMBULATORY_CARE_PROVIDER_SITE_OTHER): Payer: 59 | Admitting: Rehabilitative and Restorative Service Providers"

## 2017-08-08 DIAGNOSIS — R29898 Other symptoms and signs involving the musculoskeletal system: Secondary | ICD-10-CM | POA: Diagnosis not present

## 2017-08-08 DIAGNOSIS — M25512 Pain in left shoulder: Secondary | ICD-10-CM | POA: Diagnosis not present

## 2017-08-08 DIAGNOSIS — M25511 Pain in right shoulder: Secondary | ICD-10-CM

## 2017-08-08 DIAGNOSIS — R293 Abnormal posture: Secondary | ICD-10-CM | POA: Diagnosis not present

## 2017-08-08 NOTE — Patient Instructions (Addendum)
All  exercises 10 reps working toward 2-3 sets of 10   Shoulder Blade Squeeze: Arms at Sides    Arms at sides, parallel, elbows straight, palms up. Press pelvis down. Squeeze backbone with shoulder blades, raising front of shoulders, chest, and arms. Keep head and neck neutral. Hold ___ seconds. Relax. Repeat ___ times.   Shoulder Blade Squeeze: Fingers Interlaced    Fingers interlaced behind your body, palms facing each other. Press pelvis down. Squeeze backbone with shoulder blades. Raise front of shoulders, chest, and head. Keep neck neutral. Hold ___ seconds. Relax. Repeat ___ times.    Shoulder Blade Squeeze: W    Arms out to sides at 90 palms down. Bend elbows to 90. Press pelvis down. Squeeze backbone with shoulder blades. Raise arms, front of shoulders, chest, and head. Keep neck neutral. Hold ___ seconds. Relax. Repeat ___ times.   Shoulder Blade Squeeze: Airplane    Arms out to sides at 90, elbows straight, palms down. Press pelvis down. Squeeze backbone with shoulder blades. Raise arms, front of shoulders, chest, and head. Keep neck neutral. Hold ___ seconds. Relax. Repeat ___ times.  Shoulder Blade Squeeze: Superperson    Arms alongside head, elbows straight, palms down. Press pelvis down. Squeeze backbone with shoulder blades. Raise arms, chest, and head. Keep neck neutral. Hold ___ seconds. Relax. Repeat ___ times.   Lying on swim noodle ~ 4 inch   Strengthening: Chest Pull - Resisted    With resistive band looped around each hand, and arms straight out in front, stretch band across chest. Repeat ____ times per set. Do ____ sets per session. Do ____ sessions per day.   Strengthening: Resisted Abduction    Hold tubing with right arm across body. Pull up and away from side. Move through pain-free range of motion. Repeat ____ times per set. Do ____ sets per session. Do ____ sessions per day.    Resisted External Rotation: in Neutral -  Bilateral      tubing in both hands, elbows at sides, bent to 90, forearms forward. Pinch shoulder blades together and rotate forearms out. Keep elbows at sides. Repeat ____ times per set. Do ____ sets per session. Do ____ sessions per day.    Resisted Horizontal Abduction: Bilateral      tubing in both hands, arms out in front. Keeping arms straight, pinch shoulder blades together and stretch arms out. Repeat ____ times per set. Do ____ sets per session. Do ____ sessions per day. Can loop band around hands and tense without moving - isometric exercise     Lying on side secure band in one hand at hip level; thumb toward floor  Then bring hand back rotating trunk turning thumb toward ceiling   Can turn body slightly to side when doing the doorway stretches    Stretch midback with noodle across back or using yoga egg  Stay 2-5 minutes   ELBOW: Biceps - Standing    Standing in doorway, place one hand on wall, elbow straight. Lean forward. Hold _30__ seconds. _3__ reps 2 times a day

## 2017-08-08 NOTE — Therapy (Addendum)
[place] [address] [place], [place], [address] Phone: [phone]   Fax:  [phone]  Physical Therapy Treatment  Patient Details  Name: Spencer Jackson MRN: [id number] Date of Birth: September 24, 1980 Referring Provider: Dr Georgina Snell   Encounter Date: 08/08/2017  PT End of Session - 08/08/17 1556    Visit Number  3    Number of Visits  12    Date for PT Re-Evaluation  08/31/17    PT Start Time  7858    PT Stop Time  8502    PT Time Calculation (min)  48 min    Activity Tolerance  Patient tolerated treatment well       Past Medical History:  Diagnosis Date  . Dizziness   . Hypertension   . PAC (premature atrial contraction)   . Palpitations     Past Surgical History:  Procedure Laterality Date  . back cyst resection      There were no vitals filed for this visit.  Subjective Assessment - 08/08/17 1559    Subjective  Not sure if he is better. He has been less active and his shoulders are usually not as painful when he is less active. He is doing the doorway stretch and trying to work on the posture when he thinks about it.     Currently in Pain?  No/denies         [facility] PT Assessment - 08/08/17 0001      Assessment   Medical Diagnosis  Bilat shoulder pain     Referring Provider  Dr Georgina Snell    Onset Date/Surgical Date  06/25/17 symptoms present in past 12 years     Hand Dominance  Right    Next MD Visit  PRN     Prior Therapy  years ago for shoulders       AROM   Right Shoulder Extension  68 Degrees    Right Shoulder Flexion  157 Degrees    Right Shoulder ABduction  165 Degrees    Right Shoulder Internal Rotation  30 Degrees    Right Shoulder External Rotation  97 Degrees    Left Shoulder Extension  45 Degrees    Left Shoulder Flexion  159 Degrees    Left Shoulder ABduction  165 Degrees    Left Shoulder Internal Rotation  30 Degrees    Left Shoulder External Rotation  100 Degrees      Strength   Overall Strength Comments  5/5 bilat UE's       Palpation   Palpation comment  tight Rt > Lt pecs; upper trap; leveator; biceps                   [facility] Adult PT Treatment/Exercise - 08/08/17 0001      Neuro Re-ed    Neuro Re-ed Details   working on posture and alignment engaging posterior shoudler girdle with all exercises       Shoulder Exercises: Supine   Other Supine Exercises  lying on foam roll for horitontal abduction; SASH toward both sides; ER elbows at side x 10-15 each      Shoulder Exercises: Prone   Other Prone Exercises  extension arms at side x 10; hands clasp behind back, W's. airplane, superman x 5 each       Shoulder Exercises: Isometric Strengthening   External Rotation  Theraband standing red TB x 10       Shoulder Exercises: Stretch   Other Shoulder Stretches  3 way doorway stretch 20-30 sec x 2 each  slight turn to one side to increase stretch opposite side     Other Shoulder Stretches  biceps stretch 30 sec x 3              PT Education - 08/08/17 1633    Education provided  Yes    Education Details  HEP     Person(s) Educated  Patient    Methods  Explanation;Demonstration;Tactile cues;Verbal cues;Handout    Comprehension  Verbalized understanding;Returned demonstration;Verbal cues required;Tactile cues required          PT Long Term Goals - 08/08/17 1559      PT LONG TERM GOAL #1   Title  Improve posture and alignment with patient to demonstrate improved upright posture with posterior shoulder girdle musculature engaged 08/31/17    Time  6    Period  Weeks    Status  On-going      PT LONG TERM GOAL #2   Title  Increase bilat shoulder elevation by 5-10 deg and increase pec extensibilty 08/31/17    Time  6    Period  Weeks    Status  On-going      PT LONG TERM GOAL #3   Title  Decrease shoulder pain with patient to preform normal functional activites and sleep with minimal to no shoulder pain 08/31/17    Time  6    Period   Weeks    Status  On-going      PT LONG TERM GOAL #4   Title  Independent in HEP with appropriate home gym program to stretch anterior shoudler musculature and strengthening posterior shoudler girdle musculature 08/31/17    Time  6    Period  Weeks    Status  On-going      PT LONG TERM GOAL #5   Title  Improve FOTO to </= 26% limitation 08/31/17    Time  6    Period  Weeks    Status  On-going            Plan - 08/08/17 1652    Clinical Impression Statement  Improving mobility and ROM bilat shoudlers; decresaed pain and feeling of tightness. Patient needs continued work on stretching pecs and strengthening posterior shoulder girdle. Progressing well toward stated goals of therapy.     Rehab Potential  Good    PT Frequency  2x / week    PT Treatment/Interventions  ADLs/Self Care Home Management;Cryotherapy;Electrical Stimulation;Iontophoresis 57m/ml Dexamethasone;Moist Heat;Ultrasound;Patient/family education;Neuromuscular re-education;Dry needling;Manual techniques;Therapeutic exercise;Therapeutic activities    PT Next Visit Plan  review HEP; continue postural correction; stretch pecs; manual work through the [employer] as needed progress to posterior shoulder girdle strengthening trial of quadraped exercises; modalities as indicated add quadraped exercises and possibly planks. Decrease frequency to 1x/wk     Consulted and Agree with Plan of Care  Patient       Patient will benefit from skilled therapeutic intervention in order to improve the following deficits and impairments:  Postural dysfunction, Improper body mechanics, Increased fascial restricitons, Increased muscle spasms, Decreased mobility, Pain  Visit Diagnosis: Acute pain of right shoulder  Acute pain of left shoulder  Other symptoms and signs involving the musculoskeletal system  Abnormal posture     Problem List Patient Active Problem List   Diagnosis Date Noted  . Snores 06/04/2017  . Rectal bleeding  06/04/2017  . BMI 32.0-32.9,adult 06/04/2017  . Shoulder impingement 05/30/2016  . Spondylolisthesis of lumbar region  05/30/2016  . PAC (premature atrial contraction)     Alyssamarie Mounsey Nilda Simmer PT, MPH  08/08/2017, 4:56 PM  [facility] [place], [place], [address] Phone: [phone]   Fax:  [phone]  Name: Kynan Peasley MRN: [id number] Date of Birth: 06/28/1981  PHYSICAL THERAPY DISCHARGE SUMMARY  Visits from Start of Care: 3  Current functional level related to goals / functional outcomes: See progress note for discharge status   Remaining deficits: unknown   Education / Equipment: HEP Plan: Patient agrees to discharge.  Patient goals were partially met. Patient is being discharged due to not returning since the last visit.  ?????     Aveen Stansel P. Helene Kelp PT, MPH 09/13/17 9:30 AM

## 2017-08-10 ENCOUNTER — Encounter: Payer: Self-pay | Admitting: Rehabilitative and Restorative Service Providers"

## 2017-08-16 ENCOUNTER — Encounter: Payer: Self-pay | Admitting: Rehabilitative and Restorative Service Providers"

## 2017-09-04 DIAGNOSIS — N451 Epididymitis: Secondary | ICD-10-CM | POA: Diagnosis not present

## 2017-09-05 DIAGNOSIS — I861 Scrotal varices: Secondary | ICD-10-CM | POA: Diagnosis not present

## 2017-09-05 DIAGNOSIS — N433 Hydrocele, unspecified: Secondary | ICD-10-CM | POA: Diagnosis not present

## 2017-09-10 DIAGNOSIS — N451 Epididymitis: Secondary | ICD-10-CM | POA: Diagnosis not present

## 2017-09-19 ENCOUNTER — Encounter: Payer: Self-pay | Admitting: Internal Medicine

## 2017-09-25 ENCOUNTER — Encounter: Payer: Self-pay | Admitting: Internal Medicine

## 2017-09-25 ENCOUNTER — Ambulatory Visit (AMBULATORY_SURGERY_CENTER): Payer: 59 | Admitting: Internal Medicine

## 2017-09-25 ENCOUNTER — Other Ambulatory Visit: Payer: Self-pay

## 2017-09-25 VITALS — BP 111/68 | HR 49 | Temp 98.0°F | Resp 14 | Ht 71.0 in | Wt 232.0 lb

## 2017-09-25 DIAGNOSIS — D128 Benign neoplasm of rectum: Secondary | ICD-10-CM

## 2017-09-25 DIAGNOSIS — D122 Benign neoplasm of ascending colon: Secondary | ICD-10-CM

## 2017-09-25 DIAGNOSIS — K921 Melena: Secondary | ICD-10-CM

## 2017-09-25 DIAGNOSIS — K625 Hemorrhage of anus and rectum: Secondary | ICD-10-CM

## 2017-09-25 DIAGNOSIS — K621 Rectal polyp: Secondary | ICD-10-CM | POA: Diagnosis not present

## 2017-09-25 MED ORDER — SODIUM CHLORIDE 0.9 % IV SOLN: 500.0000 mL | Freq: Once | INTRAVENOUS | Status: DC

## 2017-09-25 NOTE — Progress Notes (Signed)
Diltiazem 2% ointment called into [facility]. Pt. Informed that he has to pick RX up at [facility].

## 2017-09-25 NOTE — Op Note (Signed)
[facility] Patient Name: Spencer Jackson Procedure Date: 09/25/2017 1:25 PM MRN: [id number] Endoscopist: Docia Chuck. Henrene Pastor , MD Age: 37 Referring MD:  Date of Birth: November 24, 1980 Gender: Male Account #: [id number] Procedure:                Colonoscopy, With snare polypectomy x 3 Indications:              Rectal bleeding Medicines:                Monitored Anesthesia Care Procedure:                Pre-Anesthesia Assessment:                           - Prior to the procedure, a History and Physical                            was performed, and patient medications and                            allergies were reviewed. The patient's tolerance of                            previous anesthesia was also reviewed. The risks                            and benefits of the procedure and the sedation                            options and risks were discussed with the patient.                            All questions were answered, and informed consent                            was obtained. Prior Anticoagulants: The patient has                            taken no previous anticoagulant or antiplatelet                            agents. ASA Grade Assessment: I - A normal, healthy                            patient. After reviewing the risks and benefits,                            the patient was deemed in satisfactory condition to                            undergo the procedure.                           After obtaining informed consent, the colonoscope  was passed under direct vision. Throughout the                            procedure, the patient's blood pressure, pulse, and                            oxygen saturations were monitored continuously. The                            Colonoscope was introduced through the anus and                            advanced to the the cecum, identified by                            appendiceal orifice and ileocecal valve.  The                            ileocecal valve, appendiceal orifice, and rectum                            were photographed. The quality of the bowel                            preparation was excellent. The colonoscopy was                            performed without difficulty. The patient tolerated                            the procedure well. The bowel preparation used was                            SUPREP. Scope In: 1:32:03 PM Scope Out: 1:50:44 PM Scope Withdrawal Time: 0 hours 15 minutes 48 seconds  Total Procedure Duration: 0 hours 18 minutes 41 seconds  Findings:                 Three sessile polyps were found in the rectum and                            ascending colon. The polyps were 3 to 6 mm in size.                            These polyps were removed with a cold snare.                            Resection and retrieval were complete.                           The exam was otherwise without abnormality on                            direct and retroflexion views. Complications:  No immediate complications. Estimated blood loss:                            None. Estimated Blood Loss:     Estimated blood loss: none. Impression:               - Posterior rectal fissure. Cause for rectal                            discomfort and bleeding                           - Three 3 to 6 mm polyps in the rectum and in the                            ascending colon, removed with a cold snare.                            Resected and retrieved.                           - The examination was otherwise normal on direct                            and retroflexion views. Recommendation:           - Repeat colonoscopy in 5-10 years for                            surveillance, Based on pathology results.                           - Patient has a contact number available for                            emergencies. The signs and symptoms of potential                            delayed  complications were discussed with the                            patient. Return to normal activities tomorrow.                            Written discharge instructions were provided to the                            patient.                           - Resume previous diet.                           - Continue present medications.                           - Await pathology  results.                           - Metamucil 2 tablespoons daily and 14 ounces of                            water                           - Prescribed diltiazem 2% ointment; one quantity; 4                            refills. Apply to anal sphincter 360                            circumference, 5 times daily, for 6 weeks Docia Chuck. Henrene Pastor, MD 09/25/2017 1:58:59 PM This report has been signed electronically.

## 2017-09-25 NOTE — Progress Notes (Signed)
Patient consents to observer being present for procedure.   

## 2017-09-25 NOTE — Progress Notes (Signed)
Called to room to assist during endoscopic procedure.  Patient ID and intended procedure confirmed with present staff. Received instructions for my participation in the procedure from the performing physician.  

## 2017-09-25 NOTE — Patient Instructions (Addendum)
YOU HAD AN ENDOSCOPIC PROCEDURE TODAY AT [facility]:   Refer to the procedure report that was given to you for any specific questions about what was found during the examination.  If the procedure report does not answer your questions, please call your gastroenterologist to clarify.  If you requested that your care partner not be given the details of your procedure findings, then the procedure report has been included in a sealed envelope for you to review at your convenience later.  YOU SHOULD EXPECT: Some feelings of bloating in the abdomen. Passage of more gas than usual.  Walking can help get rid of the air that was put into your GI tract during the procedure and reduce the bloating. If you had a lower endoscopy (such as a colonoscopy or flexible sigmoidoscopy) you may notice spotting of blood in your stool or on the toilet paper. If you underwent a bowel prep for your procedure, you may not have a normal bowel movement for a few days.  Please Note:  You might notice some irritation and congestion in your nose or some drainage.  This is from the oxygen used during your procedure.  There is no need for concern and it should clear up in a day or so.  SYMPTOMS TO REPORT IMMEDIATELY:   Following lower endoscopy (colonoscopy or flexible sigmoidoscopy):  Excessive amounts of blood in the stool  Significant tenderness or worsening of abdominal pains  Swelling of the abdomen that is new, acute  Fever of 100F or higher   For urgent or emergent issues, a gastroenterologist can be reached at any hour by calling [phone].   DIET:  We do recommend a small meal at first, but then you may proceed to your regular diet.  Drink plenty of fluids but you should avoid alcoholic beverages for 24 hours.  ACTIVITY:  You should plan to take it easy for the rest of today and you should NOT DRIVE or use heavy machinery until tomorrow (because of the sedation medicines used during the test).     FOLLOW UP: Our staff will call the number listed on your records the next business day following your procedure to check on you and address any questions or concerns that you may have regarding the information given to you following your procedure. If we do not reach you, we will leave a message.  However, if you are feeling well and you are not experiencing any problems, there is no need to return our call.  We will assume that you have returned to your regular daily activities without incident.  If any biopsies were taken you will be contacted by phone or by letter within the next 1-3 weeks.  Please call us at [phone] if you have not heard about the biopsies in 3 weeks.    SIGNATURES/CONFIDENTIALITY: You and/or your care partner have signed paperwork which will be entered into your electronic medical record.  These signatures attest to the fact that that the information above on your After Visit Summary has been reviewed and is understood.  Full responsibility of the confidentiality of this discharge information lies with you and/or your care-partner.   Resume medications. Take metamucil 2 tablespoon daily with 14 ounces of water. 2% diltiazem called into [facility]. Will be ready in 3-4 days they will call you when ready spoke with Maudie Mercury at [facility].

## 2017-09-25 NOTE — Progress Notes (Signed)
To PACU, VSS. Report to [email or web address] 

## 2017-09-26 ENCOUNTER — Telehealth: Payer: Self-pay | Admitting: *Deleted

## 2017-09-26 NOTE — Telephone Encounter (Signed)
  Follow up Call-  Call back number 09/25/2017  Post procedure Call Back phone  # [phone]  Permission to leave phone message Yes     Patient questions:  Do you have a fever, pain , or abdominal swelling? No. Pain Score  0 *  Have you tolerated food without any problems? Yes.    Have you been able to return to your normal activities? Yes.    Do you have any questions about your discharge instructions: Diet   No. Medications  No. Follow up visit  No.  Do you have questions or concerns about your Care? No.  Actions: * If pain score is 4 or above: No action needed, pain <4.

## 2017-10-02 ENCOUNTER — Encounter: Payer: Self-pay | Admitting: Internal Medicine

## 2017-11-14 ENCOUNTER — Encounter: Payer: Self-pay | Admitting: Family Medicine

## 2017-11-14 ENCOUNTER — Ambulatory Visit (INDEPENDENT_AMBULATORY_CARE_PROVIDER_SITE_OTHER): Payer: 59 | Admitting: Family Medicine

## 2017-11-14 VITALS — BP 149/102 | HR 56 | Ht 71.0 in | Wt 229.0 lb

## 2017-11-14 DIAGNOSIS — R030 Elevated blood-pressure reading, without diagnosis of hypertension: Secondary | ICD-10-CM

## 2017-11-14 DIAGNOSIS — K909 Intestinal malabsorption, unspecified: Secondary | ICD-10-CM | POA: Diagnosis not present

## 2017-11-14 NOTE — Patient Instructions (Signed)
Thank you for coming in today. We are getting labs to help start the work up.  If normal next step is the fecal fat test.  Please generate a blood pressure log and send it to me in a few weeks.  I recommend follow up with gastroenterology as arranged.  Recheck sooner if needed.    Celiac Disease Celiac disease is an allergy to the protein that is called gluten. When a person with celiac disease eats a food that has gluten in it, his or her natural defense system (immune system) attacks the cells that line the small intestine. Over time, this reaction damages the small intestine and makes the small intestine unable to absorb nutrients from food. Gluten is found in wheat, rye, and barley and in foods like pasta, pizza, and cereal. Celiac disease is also known as celiac sprue, nontropical sprue, and gluten-sensitive enteropathy. What are the causes? This condition is caused by a gene that is passed down through families (inherited). What increases the risk? This condition is more likely to develop in people who have a family member with the disease. What are the signs or symptoms? Symptoms of this condition include:  Recurring bloating and pain in the abdomen.  Gas.  Long-term (chronic) diarrhea.  Pale, bad-smelling, greasy, or oily stool.  Weight loss.  Missed menstrual periods.  Weakening bones (osteoporosis).  Fatigue and weakness.  Tingling or other signs of nerve damage.  Depression.  Poor appetite.  Rash.  In some cases, there are no symptoms. How is this diagnosed? This condition is diagnosed with a physical exam and tests. Tests may include:  Blood tests to check for nutritional deficiencies.  Blood tests to look for evidence that the body is attacking cells in the small intestine.  A test in which a sample of tissue is taken from the small bowel and examined under a microscope (biopsy).  X-rays of the bowel.  Stool tests.  Tests to check for nutrient  absorption from the intestine.  How is this treated? There is no cure for this condition, but it can be managed with a gluten-free diet. Treatment may also involve avoiding dairy foods, such as milk and cheese, because they are hard to digest. Most people who follow a gluten-free diet feel better and stop having symptoms. The intestine usually heals within 3 months to 2 years. In a small percentage of people, this condition does not improve on the gluten-free diet. If your condition does not improve, more tests will be done. You will also need to work with a specialist in celiac disease to find the best treatment for you. Follow these instructions at home:  Follow instructions from your health care provider about diet.  Monitor your body's response to the gluten-free diet. Write down any changes in your symptoms and changes in how you feel.  If you decide to eat outside of the home, prepare your meal ahead of time, or make sure that the place where you are going has gluten-free options.  Keep all follow-up visits as told by your health care provider. This is important.  Suggest to family members that they get screened for early signs of the disease. Contact a health care provider if:  You continue to have symptoms, even when you are eating a gluten-free diet.  You have trouble sticking to the gluten-free diet.  You develop an itchy rash with groups of tiny blisters.  You develop severe weakness.  You develop balance problems.  You develop new symptoms.  This information is not intended to replace advice given to you by your health care provider. Make sure you discuss any questions you have with your health care provider. Document Released: 08/28/2005 Document Revised: 02/03/2016 Document Reviewed: 12/21/2014 Elsevier Interactive Patient Education  [employer].

## 2017-11-14 NOTE — Progress Notes (Signed)
Spencer Jackson is a 37 y.o. male who presents to [facility] today for fatty stools.  Spencer Jackson notes a several month history of a skim of oil in the toilet bowl with stools.  He notes most the time he does not have significant diarrhea.  He denies significant constipation but does note occasional cramping and discomfort most of his life.  He has increased his fiber and notes this has helped form his stools up better but not improve the oily residue present in the toilet bowl with stools.  He denies any abdominal pain fevers or chills or vomiting.  He notes he recently had a workup for rectal bleeding with a colonoscopy performed January 15 showing a few polyps and a rectal fissure but otherwise unremarkable.   Spencer Jackson has done some reading and is concerned about possible malabsorptive process.  He notes he has arranged a follow-up appointment with gastroenterology later this month but would like to get started with the workup now if possible.  He denies significant weight loss jaundice.  He notes his stools are not chalky colored.   Past Medical History:  Diagnosis Date  . Allergy   . Dizziness   . Hypertension    no medications at this time 09-25-17  . PAC (premature atrial contraction)   . Palpitations    Past Surgical History:  Procedure Laterality Date  . back cyst resection     Social History   Tobacco Use  . Smoking status: Former Smoker    Last attempt to quit: 11/25/2005    Years since quitting: 11.9  . Smokeless tobacco: Never Used  Substance Use Topics  . Alcohol use: Yes    Alcohol/week: 5.4 oz    Types: 9 Cans of beer per week   family history includes Hepatitis in his mother.  ROS as above:  Medications: No current outpatient medications on file.   Current Facility-Administered Medications  Medication Dose Route Frequency Provider Last Rate Last Dose    . 0.9 %  sodium chloride infusion  500 mL Intravenous Once Irene Shipper, MD       No Known Allergies  Health Maintenance Health Maintenance  Topic Date Due  . COLONOSCOPY  09/25/2022  . TETANUS/TDAP  05/30/2026  . INFLUENZA VACCINE  Completed  . HIV Screening  Completed     Exam:  BP (!) 149/102   Pulse (!) 56   Ht 5\' 11"  (1.803 m)   Wt 229 lb (103.9 kg)   BMI 31.94 kg/m  Gen: Well NAD  HEENT: EOMI,  MMM no scleral icterus Lungs: Normal work of breathing. CTABL Heart: RRR no MRG heart rate 62 bpm per my check Abd: NABS, Soft. Nondistended, Nontender Exts: Brisk capillary refill, warm and well perfused.   Colonoscopy dated January 15 reviewed    Chemistry      Component Value Date/Time   NA 139 06/04/2017 0929   K 4.7 06/04/2017 0929   CL 103 06/04/2017 0929   CO2 29 06/04/2017 0929   BUN 11 06/04/2017 0929   CREATININE 0.90 06/04/2017 0929      Component Value Date/Time   CALCIUM 8.8 06/04/2017 0929   ALKPHOS 44 05/30/2016 0954   AST 17 06/04/2017 0929   ALT 12 06/04/2017 0929   BILITOT 0.5 06/04/2017 0929        Assessment and Plan: 37 y.o. male with persistent oily stool is not a normal symptom.  I agree  that workup is probably warranted.  I do not think that Spencer Jackson is eating a diet super high in fat that could explain excessive presence of fat in his stool.  Plan for limited metabolic workup listed below to assess for jaundice transaminitis S.  Additionally we will do a celiac disease panel.  If all is well next step would probably be a fecal fat analysis.  Again if nothing really comes back it is reasonable to consider follow-up with gastroenterology.  Blood pressure in the hypertensive range today which is not typical for Spencer Jackson.  He has had borderline elevated blood pressure in the past.  I have asked him to keep home blood pressure log and report back in a few weeks.   Orders Placed This Encounter  Procedures  . CBC  . COMPLETE METABOLIC PANEL WITH  GFR  . Lipase  . Amylase  . Celiac Disease Comprehensive Panel w Reflexes, Infant   No orders of the defined types were placed in this encounter.    Discussed warning signs or symptoms. Please see discharge instructions. Patient expresses understanding.

## 2017-11-15 LAB — COMPLETE METABOLIC PANEL WITH GFR
AG Ratio: 1.7 (calc) (ref 1.0–2.5)
ALKALINE PHOSPHATASE (APISO): 56 U/L (ref 40–115)
ALT: 12 U/L (ref 9–46)
AST: 17 U/L (ref 10–40)
Albumin: 4.6 g/dL (ref 3.6–5.1)
BUN: 16 mg/dL (ref 7–25)
CO2: 27 mmol/L (ref 20–32)
CREATININE: 1.06 mg/dL (ref 0.60–1.35)
Calcium: 9.2 mg/dL (ref 8.6–10.3)
Chloride: 105 mmol/L (ref 98–110)
GFR, EST NON AFRICAN AMERICAN: 90 mL/min/{1.73_m2} (ref >=60)
GFR, Est African American: 104 mL/min/{1.73_m2} (ref >=60)
GLOBULIN: 2.7 g/dL (ref 1.9–3.7)
GLUCOSE: 91 mg/dL (ref 65–99)
Potassium: 4.4 mmol/L (ref 3.5–5.3)
SODIUM: 139 mmol/L (ref 135–146)
Total Bilirubin: 0.6 mg/dL (ref 0.2–1.2)
Total Protein: 7.3 g/dL (ref 6.1–8.1)

## 2017-11-15 LAB — CBC
HEMATOCRIT: 46.1 % (ref 38.5–50.0)
Hemoglobin: 15.9 g/dL (ref 13.2–17.1)
MCH: 29.6 pg (ref 27.0–33.0)
MCHC: 34.5 g/dL (ref 32.0–36.0)
MCV: 85.7 fL (ref 80.0–100.0)
MPV: 9.4 fL (ref 7.5–12.5)
PLATELETS: 275 10*3/uL (ref 140–400)
RBC: 5.38 10*6/uL (ref 4.20–5.80)
RDW: 12.7 % (ref 11.0–15.0)
WBC: 6 10*3/uL (ref 3.8–10.8)

## 2017-11-15 LAB — CELIAC DISEASE COMPREHENSIVE PANEL WITH REFLEXES
(TTG) AB, IGA: 1 U/mL
IMMUNOGLOBULIN A: 208 mg/dL (ref 81–463)

## 2017-11-15 LAB — AMYLASE: AMYLASE: 41 U/L (ref 21–101)

## 2017-11-15 LAB — LIPASE: LIPASE: 9 U/L (ref 7–60)

## 2017-11-28 ENCOUNTER — Encounter: Payer: Self-pay | Admitting: Family Medicine

## 2017-11-28 DIAGNOSIS — R195 Other fecal abnormalities: Secondary | ICD-10-CM

## 2017-12-18 ENCOUNTER — Ambulatory Visit (INDEPENDENT_AMBULATORY_CARE_PROVIDER_SITE_OTHER): Payer: 59 | Admitting: Internal Medicine

## 2017-12-18 ENCOUNTER — Encounter: Payer: Self-pay | Admitting: Internal Medicine

## 2017-12-18 VITALS — BP 118/76 | HR 70 | Ht 71.0 in | Wt 231.1 lb

## 2017-12-18 DIAGNOSIS — D122 Benign neoplasm of ascending colon: Secondary | ICD-10-CM

## 2017-12-18 DIAGNOSIS — R194 Change in bowel habit: Secondary | ICD-10-CM

## 2017-12-18 NOTE — Patient Instructions (Signed)
Please follow up as needed 

## 2017-12-18 NOTE — Progress Notes (Signed)
HISTORY OF PRESENT ILLNESS:  Spencer Jackson is a 37 y.o. male with a history of rectal bleeding and anal fissure for which she underwent colonoscopy 09/25/2017. Aside from a posterior fissure, the examination revealed diminutive colon polyps which were hyperplastic and sessile serrated without dysplasia. Follow-up in 5 years recommended. Patient presents today with chief complaint of change in bowel habits.He describes a film with his bowel movement which has concerned him over the possibility of malabsorption after he investigated his concern online. He was seen by his primary care provider Dr. Georgina Jackson 6 2019. As part of that evaluation the patient underwent CBC, comprehensive metabolic panel, amylase, lipase, and celiac panel. These were all normal. Stool for qualitative fat was considered but not performed. Patient has had no change in weight over the past 2 years. He describes one to 3 formed bowel movements per day which are brown and generally sink. He denies medications or over-the-counter entities. Problems with his fissure have improved.he denies abdominal pain, increased intestinal gas, or other GI issues  REVIEW OF SYSTEMS:  All non-GI ROS negative unless otherwise stated in the history of present illness except for allergies  Past Medical History:  Diagnosis Date  . Allergy   . Dizziness   . Hypertension    no medications at this time 09-25-17  . PAC (premature atrial contraction)   . Palpitations     Past Surgical History:  Procedure Laterality Date  . back cyst resection      Social History Spencer Jackson  reports that he quit smoking about 12 years ago. He has never used smokeless tobacco. He reports that he drinks about 5.4 oz of alcohol per week. He reports that he does not use drugs.  family history includes Hepatitis in his mother.  No Known Allergies     PHYSICAL EXAMINATION: Vital signs: BP 118/76   Pulse 70   Ht 5\' 11"  (1.803 m)   Wt 231 lb 2 oz (104.8 kg)   BMI  32.24 kg/m   Constitutional: generally well-appearing, no acute distress Psychiatric: alert and oriented x3, cooperative Eyes: extraocular movements intact, anicteric, conjunctiva pink Mouth: oral pharynx moist, no lesions Neck: supple no lymphadenopathy Cardiovascular: heart regular rate and rhythm, no murmur Lungs: clear to auscultation bilaterally Abdomen: soft, nontender, nondistended, no obvious ascites, no peritoneal signs, normal bowel sounds, no organomegaly Rectal:omitted Extremities: no clubbing cyanosis or lower extremity edema bilaterally Skin: no lesions on visible extremities Neuro: No focal deficits.    ASSESSMENT:  #1. Nonspecific complaint of film with bowel movement. Does not sound like steatorrhea. Maybe mucus. No alarm features. Negative recent laboratories. Essentially negative colonoscopy save diminutive polyps   PLAN:  #1. Reassurance #2. Discussed what alarm features would prompt reevaluation. #3. Surveillance colonoscopy around January 2024 #4. Resume general medical care with Dr. Georgina Jackson

## 2018-05-16 ENCOUNTER — Ambulatory Visit (INDEPENDENT_AMBULATORY_CARE_PROVIDER_SITE_OTHER): Payer: 59 | Admitting: Family Medicine

## 2018-05-16 ENCOUNTER — Encounter: Payer: Self-pay | Admitting: Family Medicine

## 2018-05-16 VITALS — BP 147/87 | HR 52 | Temp 97.8°F | Wt 224.9 lb

## 2018-05-16 DIAGNOSIS — F1010 Alcohol abuse, uncomplicated: Secondary | ICD-10-CM | POA: Insufficient documentation

## 2018-05-16 DIAGNOSIS — I10 Essential (primary) hypertension: Secondary | ICD-10-CM | POA: Diagnosis not present

## 2018-05-16 DIAGNOSIS — Z6831 Body mass index (BMI) 31.0-31.9, adult: Secondary | ICD-10-CM | POA: Diagnosis not present

## 2018-05-16 NOTE — Progress Notes (Addendum)
Spencer Jackson is a 37 y.o. male who presents to [facility] today for discuss hypertension and alcohol.  [name] notes that he is drinking more alcohol than he would like.  He has had problems cut back on alcohol in the past but over the last several months has increased his alcohol intake.  He is drinking 4-5 beers 4-5 nights a week.  He sometimes have trouble cutting back on alcohol.  He denies any significant consequences such as legal consequences job consequences relationship consequences.  His wife also would like him to decrease his alcohol use as well.  He is never been diagnosed with alcohol abuse or other similar medical problems.  He notes he like to decrease his alcohol use because of that he did feel better but also would like to lose a bit of weight and avoid taking blood pressure medications.  Additionally he has a diagnosis of hypertension.  He is not currently taking blood pressure medication as he like to avoid it.  No chest pain palpitations or shortness of breath.  Blood pressure at home is typically 130s over 80s.  ROS as above:  Exam:  BP (!) 147/87   Pulse (!) 52   Temp 97.8 F (36.6 C) (Oral)   Wt 224 lb 14.4 oz (102 kg)   BMI 31.37 kg/m  Wt Readings from Last 5 Encounters:  05/16/18 224 lb 14.4 oz (102 kg)  12/18/17 231 lb 2 oz (104.8 kg)  11/14/17 229 lb (103.9 kg)  09/25/17 232 lb (105.2 kg)  07/26/17 232 lb (105.2 kg)    Gen: Well NAD HEENT: EOMI,  MMM Lungs: Normal work of breathing. CTABL Heart: RRR no MRG Abd: NABS, Soft. Nondistended, Nontender Exts: Brisk capillary refill, warm and well perfused.  Alert and oriented normal speech thought process and affect. Depression screen [facility] 2/9 05/16/2018 06/04/2017 06/04/2017 05/30/2016  Decreased Interest 1 0 0 2  Down, Depressed, Hopeless 1 0 0 1  PHQ - 2 Score 2 0 0 3  Altered sleeping 0 2 - 0   Tired, decreased energy 3 2 - 2  Change in appetite 1 1 - 2  Feeling bad or failure about yourself  1 0 - 0  Trouble concentrating 2 0 - 1  Moving slowly or fidgety/restless 0 0 - 2  Suicidal thoughts 0 0 - 0  PHQ-9 Score 9 5 - 10  Difficult doing work/chores Somewhat difficult - - Very difficult  Some encounter information is confidential and restricted. Go to Review Flowsheets activity to see all data.   GAD 7 : Generalized Anxiety Score 05/16/2018 06/04/2017 05/30/2016  Nervous, Anxious, on Edge 0 0 1  Control/stop worrying 0 0 0  Worry too much - different things 0 0 0  Trouble relaxing 0 0 0  Restless 0 0 2  Easily annoyed or irritable 1 1 2   Afraid - awful might happen 0 0 0  Total GAD 7 Score 1 1 5   Anxiety Difficulty Not difficult at all - Somewhat difficult  Some encounter information is confidential and restricted. Go to Review Flowsheets activity to see all data.        Office Visit from 05/16/2018 in [facility]  Alcohol Use Disorder Identification Test Final Score (AUDIT)  13       Lab and Radiology Results   Chemistry      Component Value Date/Time   NA 139  11/14/2017 1604   K 4.4 11/14/2017 1604   CL 105 11/14/2017 1604   CO2 27 11/14/2017 1604   BUN 16 11/14/2017 1604   CREATININE 1.06 11/14/2017 1604      Component Value Date/Time   CALCIUM 9.2 11/14/2017 1604   ALKPHOS 44 05/30/2016 0954   AST 17 11/14/2017 1604   ALT 12 11/14/2017 1604   BILITOT 0.6 11/14/2017 1604         Assessment and Plan: 37 y.o. male with  Alcohol use disorder: Lengthy discussion.  Patient agreeable to cutting back or abstinence.  Recommend total absence for a little while at least.  Additionally discussed options including Alcoholics Anonymous or counseling.  Provided number to the [facility] and recommend searching out licensed clinical social worker who specializes in alcohol use disorder.  Lab review from March 2018 shows no  evidence of transaminitis.  Plan for recheck in 6 weeks.  Hypertension: Blood pressure significantly higher than ideal today.  Even at home his blood pressure still not in range.  Patient is just about to embark on lifestyle change.  I am anticipating weight loss with decreased alcohol intake as well as improve blood pressure.  Plan for recheck in 6 weeks.  I spent 25 minutes with this patient, greater than 50% was face-to-face time counseling regarding ddx and plan.  Historical information moved to improve visibility of documentation.  Past Medical History:  Diagnosis Date  . Allergy   . Dizziness   . Hypertension    no medications at this time 09-25-17  . PAC (premature atrial contraction)   . Palpitations    Past Surgical History:  Procedure Laterality Date  . back cyst resection     Social History   Tobacco Use  . Smoking status: Former Smoker    Last attempt to quit: 11/25/2005    Years since quitting: 12.4  . Smokeless tobacco: Never Used  Substance Use Topics  . Alcohol use: Yes    Alcohol/week: 9.0 standard drinks    Types: 9 Cans of beer per week   family history includes Hepatitis in his mother.  Medications: No current outpatient medications on file.   Current Facility-Administered Medications  Medication Dose Route Frequency Provider Last Rate Last Dose  . 0.9 %  sodium chloride infusion  500 mL Intravenous Once Irene Shipper, MD       No Known Allergies   Discussed warning signs or symptoms. Please see discharge instructions. Patient expresses understanding.

## 2018-05-16 NOTE — Patient Instructions (Addendum)
Thank you for coming in today. [facility] is a good resource for alcohol counseling [phone] Also search for LCSW Alcohol.    I recommend cutting back or quitting alcohol significantly.    I anticipate that your blood pressure will significantly improve with weight loss due to reduced drinking.   Recheck in 6 weeks.  Return sooner if needed.  Let me know if you run into any brick walls or have trouble.    Alcohol Use Disorder Alcohol use disorder is when your drinking disrupts your daily life. When you have this condition, you drink too much alcohol and you cannot control your drinking. Alcohol use disorder can cause serious problems with your physical health. It can affect your brain, heart, liver, pancreas, immune system, stomach, and intestines. Alcohol use disorder can increase your risk for certain cancers and cause problems with your mental health, such as depression, anxiety, psychosis, delirium, and dementia. People with this disorder risk hurting themselves and others. What are the causes? This condition is caused by drinking too much alcohol over time. It is not caused by drinking too much alcohol only one or two times. Some people with this condition drink alcohol to cope with or escape from negative life events. Others drink to relieve pain or symptoms of mental illness. What increases the risk? You are more likely to develop this condition if:  You have a family history of alcohol use disorder.  Your culture encourages drinking to the point of intoxication, or makes alcohol easy to get.  You had a mood or conduct disorder in childhood.  You have been a victim of abuse.  You are an adolescent and: ? You have poor grades or difficulties in school. ? Your caregivers do not talk to you about saying no to alcohol, or supervise your activities. ? You are impulsive or you have trouble with self-control.  What are the signs or symptoms? Symptoms of this condition  include:  Drinkingmore than you want to.  Drinking for longer than you want to.  Trying several times to drink less or to control your drinking.  Spending a lot of time getting alcohol, drinking, or recovering from drinking.  Craving alcohol.  Having problems at work, at school, or at home due to drinking.  Having problems in relationships due to drinking.  Drinking when it is dangerous to drink, such as before driving a car.  Continuing to drink even though you know you might have a physical or mental problem related to drinking.  Needing more and more alcohol to get the same effect you want from the alcohol (building up tolerance).  Having symptoms of withdrawal when you stop drinking. Symptoms of withdrawal include: ? Fatigue. ? Nightmares. ? Trouble sleeping. ? Depression. ? Anxiety. ? Fever. ? Seizures. ? Severe confusion. ? Feeling or seeing things that are not there (hallucinations). ? Tremors. ? Rapid heart rate. ? Rapid breathing. ? High blood pressure.  Drinking to avoid symptoms of withdrawal.  How is this diagnosed? This condition is diagnosed with an assessment. Your health care provider may start the assessment by asking three or four questions about your drinking. Your health care provider may perform a physical exam or do lab tests to see if you have physical problems resulting from alcohol use. She or he may refer you to a mental health professional for evaluation. How is this treated? Some people with alcohol use disorder are able to reduce their alcohol use to low-risk levels. Others need to  completely quit drinking alcohol. When necessary, mental health professionals with specialized training in substance use treatment can help. Your health care provider can help you decide how severe your alcohol use disorder is and what type of treatment you need. The following forms of treatment are available:  Detoxification. Detoxification involves quitting  drinking and using prescription medicines within the first week to help lessen withdrawal symptoms. This treatment is important for people who have had withdrawal symptoms before and for heavy drinkers who are likely to have withdrawal symptoms. Alcohol withdrawal can be dangerous, and in severe cases, it can cause death. Detoxification may be provided in a home, community, or primary care setting, or in a hospital or substance use treatment facility.  Counseling. This treatment is also called talk therapy. It is provided by substance use treatment counselors. A counselor can address the reasons you use alcohol and suggest ways to keep you from drinking again or to prevent problem drinking. The goals of talk therapy are to: ? Find healthy activities and ways for you to cope with stress. ? Identify and avoid the things that trigger your alcohol use. ? Help you learn how to handle cravings.  Medicines.Medicines can help treat alcohol use disorder by: ? Decreasing alcohol cravings. ? Decreasing the positive feeling you have when you drink alcohol. ? Causing an uncomfortable physical reaction when you drink alcohol (aversion therapy).  Support groups. Support groups are led by people who have quit drinking. They provide emotional support, advice, and guidance.  These forms of treatment are often combined. Some people with this condition benefit from a combination of treatments provided by specialized substance use treatment centers. Follow these instructions at home:  Take over-the-counter and prescription medicines only as told by your health care provider.  Check with your health care provider before starting any new medicines.  Ask friends and family members not to offer you alcohol.  Avoid situations where alcohol is served, including gatherings where others are drinking alcohol.  Create a plan for what to do when you are tempted to use alcohol.  Find hobbies or activities that you enjoy  that do not include alcohol.  Keep all follow-up visits as told by your health care provider. This is important. How is this prevented?  If you drink, limit alcohol intake to no more than 1 drink a day for nonpregnant women and 2 drinks a day for men. One drink equals 12 oz of beer, 5 oz of wine, or 1 oz of hard liquor.  If you have a mental health condition, get treatment and support.  Do not give alcohol to adolescents.  If you are an adolescent: ? Do not drink alcohol. ? Do not be afraid to say no if someone offers you alcohol. Speak up about why you do not want to drink. You can be a positive role model for your friends and set a good example for those around you by not drinking alcohol. ? If your friends drink, spend time with others who do not drink alcohol. Make new friends who do not use alcohol. ? Find healthy ways to manage stress and emotions, such as meditation or deep breathing, exercise, spending time in nature, listening to music, or talking with a trusted friend or family member. Contact a health care provider if:  You are not able to take your medicines as told.  Your symptoms get worse.  You return to drinking alcohol (relapse) and your symptoms get worse. Get help right away if:  You have thoughts about hurting yourself or others. If you ever feel like you may hurt yourself or others, or have thoughts about taking your own life, get help right away. You can go to your nearest emergency department or call:  Your local emergency services (911 in the [place]  A suicide crisis helpline, such as the [facility] at 2816630815. This is open 24 hours a day.  Summary  Alcohol use disorder is when your drinking disrupts your daily life. When you have this condition, you drink too much alcohol and you cannot control your drinking.  Treatment may include detoxification, counseling, medicine, and support groups.  Ask friends and family members  not to offer you alcohol. Avoid situations where alcohol is served.  Get help right away if you have thoughts about hurting yourself or others. This information is not intended to replace advice given to you by your health care provider. Make sure you discuss any questions you have with your health care provider. Document Released: 10/05/2004 Document Revised: 05/25/2016 Document Reviewed: 05/25/2016 Elsevier Interactive Patient Education  [employer].

## 2018-05-23 ENCOUNTER — Encounter: Payer: Self-pay | Admitting: Family Medicine

## 2018-05-31 ENCOUNTER — Encounter: Payer: Self-pay | Admitting: Family Medicine

## 2018-06-04 ENCOUNTER — Ambulatory Visit (INDEPENDENT_AMBULATORY_CARE_PROVIDER_SITE_OTHER): Payer: 59 | Admitting: Family Medicine

## 2018-06-04 ENCOUNTER — Encounter: Payer: Self-pay | Admitting: Family Medicine

## 2018-06-04 VITALS — BP 141/93 | HR 44 | Ht 71.0 in | Wt 223.0 lb

## 2018-06-04 DIAGNOSIS — Z0000 Encounter for general adult medical examination without abnormal findings: Secondary | ICD-10-CM

## 2018-06-04 DIAGNOSIS — I10 Essential (primary) hypertension: Secondary | ICD-10-CM

## 2018-06-04 DIAGNOSIS — Z6831 Body mass index (BMI) 31.0-31.9, adult: Secondary | ICD-10-CM | POA: Diagnosis not present

## 2018-06-04 MED ORDER — LISINOPRIL 10 MG PO TABS: 10.0000 mg | ORAL_TABLET | Freq: Every day | ORAL | 0 refills | Status: DC

## 2018-06-04 NOTE — Progress Notes (Signed)
Spencer Jackson is a 37 y.o. male who presents to [place]: [place] today for well adult visit.  Spencer Jackson is doing well.  He was seen earlier this month for alcohol abuse.  He has not been able to seek out counseling yet for this.  He has cut back a bit.  He exercises regularly and tries to eat a healthy diet.  He notes a history of hypertension.  He notes blood pressure is elevated at home.  He notes the past he has taking lisinopril in the past and tolerated it well.   ROS as above:  Past Medical History:  Diagnosis Date  . Allergy   . Dizziness   . Hypertension    no medications at this time 09-25-17  . PAC (premature atrial contraction)   . Palpitations    Past Surgical History:  Procedure Laterality Date  . back cyst resection     Social History   Tobacco Use  . Smoking status: Former Smoker    Last attempt to quit: 11/25/2005    Years since quitting: 12.5  . Smokeless tobacco: Never Used  Substance Use Topics  . Alcohol use: Yes    Alcohol/week: 9.0 standard drinks    Types: 9 Cans of beer per week   family history includes Hepatitis in his mother.  Medications: Current Outpatient Medications  Medication Sig Dispense Refill  . lisinopril (PRINIVIL,ZESTRIL) 10 MG tablet Take 1 tablet (10 mg total) by mouth daily. 90 tablet 0   No current facility-administered medications for this visit.    No Known Allergies  Health Maintenance Health Maintenance  Topic Date Due  . INFLUENZA VACCINE  01/03/2019 (Originally 04/11/2018)  . COLONOSCOPY  09/25/2022  . TETANUS/TDAP  05/30/2026  . HIV Screening  Completed     Exam:  BP (!) 141/93   Pulse (!) 44   Ht 5\' 11"  (1.803 m)   Wt 223 lb (101.2 kg)   BMI 31.10 kg/m  Wt Readings from Last 5 Encounters:  06/04/18 223 lb (101.2 kg)  05/16/18 224 lb 14.4 oz (102 kg)  12/18/17 231 lb 2 oz (104.8 kg)  11/14/17  229 lb (103.9 kg)  09/25/17 232 lb (105.2 kg)      Gen: Well NAD HEENT: EOMI,  MMM Lungs: Normal work of breathing. CTABL Heart: RRR no MRG Abd: NABS, Soft. Nondistended, Nontender Exts: Brisk capillary refill, warm and well perfused.  Psych: Alert and oriented normal speech thought process and affect.  Depression screen [facility] 2/9 05/16/2018 06/04/2017 06/04/2017 05/30/2016  Decreased Interest 1 0 0 2  Down, Depressed, Hopeless 1 0 0 1  PHQ - 2 Score 2 0 0 3  Altered sleeping 0 2 - 0  Tired, decreased energy 3 2 - 2  Change in appetite 1 1 - 2  Feeling bad or failure about yourself  1 0 - 0  Trouble concentrating 2 0 - 1  Moving slowly or fidgety/restless 0 0 - 2  Suicidal thoughts 0 0 - 0  PHQ-9 Score 9 5 - 10  Difficult doing work/chores Somewhat difficult - - Very difficult  Some encounter information is confidential and restricted. Go to Review Flowsheets activity to see all data.       Lab and Radiology Results No results found for this or any previous visit (from the past 72 hour(s)). No results found.    Assessment and Plan: 37 y.o. male with  Well adult: Doing reasonably  well.  Continue to work on alcohol use.  Hypertension: Start lisinopril and recheck as scheduled on October 17.  Will check fasting labs then.  Return sooner if needed.   Orders Placed This Encounter  Procedures  . COMPLETE METABOLIC PANEL WITH GFR  . Lipid Panel w/reflex Direct LDL   Meds ordered this encounter  Medications  . lisinopril (PRINIVIL,ZESTRIL) 10 MG tablet    Sig: Take 1 tablet (10 mg total) by mouth daily.    Dispense:  90 tablet    Refill:  0     Discussed warning signs or symptoms. Please see discharge instructions. Patient expresses understanding.

## 2018-06-04 NOTE — Patient Instructions (Addendum)
Thank you for coming in today. Start lisinopril daily.    I recommend therpay for anxiety and for alcohol.  Continue to work on cutting back or stopping drinking.   Recheck as scheduled on Oct 17th. Come fasting for labs.   Lisinopril tablets What is this medicine? LISINOPRIL (lyse IN oh pril) is an ACE inhibitor. This medicine is used to treat high blood pressure and heart failure. It is also used to protect the heart immediately after a heart attack. This medicine may be used for other purposes; ask your health care provider or pharmacist if you have questions. COMMON BRAND NAME(S): Prinivil, Zestril What should I tell my health care provider before I take this medicine? They need to know if you have any of these conditions: -diabetes -heart or blood vessel disease -kidney disease -low blood pressure -previous swelling of the tongue, face, or lips with difficulty breathing, difficulty swallowing, hoarseness, or tightening of the throat -an unusual or allergic reaction to lisinopril, other ACE inhibitors, insect venom, foods, dyes, or preservatives -pregnant or trying to get pregnant -breast-feeding How should I use this medicine? Take this medicine by mouth with a glass of water. Follow the directions on your prescription label. You may take this medicine with or without food. If it upsets your stomach, take it with food. Take your medicine at regular intervals. Do not take it more often than directed. Do not stop taking except on your doctor's advice. Talk to your pediatrician regarding the use of this medicine in children. Special care may be needed. While this drug may be prescribed for children as young as 60 years of age for selected conditions, precautions do apply. Overdosage: If you think you have taken too much of this medicine contact a poison control center or emergency room at once. NOTE: This medicine is only for you. Do not share this medicine with others. What if I miss a  dose? If you miss a dose, take it as soon as you can. If it is almost time for your next dose, take only that dose. Do not take double or extra doses. What may interact with this medicine? Do not take this medicine with any of the following medications: -hymenoptera venom -sacubitril; valsartan This medicines may also interact with the following medications: -aliskiren -angiotensin receptor blockers, like losartan or valsartan -certain medicines for diabetes -diuretics -everolimus -gold compounds -lithium -NSAIDs, medicines for pain and inflammation, like ibuprofen or naproxen -potassium salts or supplements -salt substitutes -sirolimus -temsirolimus This list may not describe all possible interactions. Give your health care provider a list of all the medicines, herbs, non-prescription drugs, or dietary supplements you use. Also tell them if you smoke, drink alcohol, or use illegal drugs. Some items may interact with your medicine. What should I watch for while using this medicine? Visit your doctor or health care professional for regular check ups. Check your blood pressure as directed. Ask your doctor what your blood pressure should be, and when you should contact him or her. Do not treat yourself for coughs, colds, or pain while you are using this medicine without asking your doctor or health care professional for advice. Some ingredients may increase your blood pressure. Women should inform their doctor if they wish to become pregnant or think they might be pregnant. There is a potential for serious side effects to an unborn child. Talk to your health care professional or pharmacist for more information. Check with your doctor or health care professional if you get an  attack of severe diarrhea, nausea and vomiting, or if you sweat a lot. The loss of too much body fluid can make it dangerous for you to take this medicine. You may get drowsy or dizzy. Do not drive, use machinery, or do  anything that needs mental alertness until you know how this drug affects you. Do not stand or sit up quickly, especially if you are an older patient. This reduces the risk of dizzy or fainting spells. Alcohol can make you more drowsy and dizzy. Avoid alcoholic drinks. Avoid salt substitutes unless you are told otherwise by your doctor or health care professional. What side effects may I notice from receiving this medicine? Side effects that you should report to your doctor or health care professional as soon as possible: -allergic reactions like skin rash, itching or hives, swelling of the hands, feet, face, lips, throat, or tongue -breathing problems -signs and symptoms of kidney injury like trouble passing urine or change in the amount of urine -signs and symptoms of increased potassium like muscle weakness; chest pain; or fast, irregular heartbeat -signs and symptoms of liver injury like dark yellow or brown urine; general ill feeling or flu-like symptoms; light-colored stools; loss of appetite; nausea; right upper belly pain; unusually weak or tired; yellowing of the eyes or skin -signs and symptoms of low blood pressure like dizziness; feeling faint or lightheaded, falls; unusually weak or tired -stomach pain with or without nausea and vomiting Side effects that usually do not require medical attention (report to your doctor or health care professional if they continue or are bothersome): -changes in taste -cough -dizziness -fever -headache -sensitivity to light This list may not describe all possible side effects. Call your doctor for medical advice about side effects. You may report side effects to FDA at 1-800-FDA-1088. Where should I keep my medicine? Keep out of the reach of children. Store at room temperature between 15 and 30 degrees C (59 and 86 degrees F). Protect from moisture. Keep container tightly closed. Throw away any unused medicine after the expiration date. NOTE: This  sheet is a summary. It may not cover all possible information. If you have questions about this medicine, talk to your doctor, pharmacist, or health care provider.  2018 Elsevier/Gold Standard (2015-10-18 12:52:35)

## 2018-06-27 ENCOUNTER — Ambulatory Visit (INDEPENDENT_AMBULATORY_CARE_PROVIDER_SITE_OTHER): Payer: 59 | Admitting: Family Medicine

## 2018-06-27 ENCOUNTER — Encounter: Payer: Self-pay | Admitting: Family Medicine

## 2018-06-27 VITALS — BP 134/86 | HR 85 | Ht 71.0 in | Wt 218.0 lb

## 2018-06-27 DIAGNOSIS — Z6830 Body mass index (BMI) 30.0-30.9, adult: Secondary | ICD-10-CM

## 2018-06-27 DIAGNOSIS — F1010 Alcohol abuse, uncomplicated: Secondary | ICD-10-CM | POA: Diagnosis not present

## 2018-06-27 DIAGNOSIS — I10 Essential (primary) hypertension: Secondary | ICD-10-CM

## 2018-06-27 NOTE — Progress Notes (Signed)
       Spencer Jackson is a 37 y.o. male who presents to [place]: [place] today for follow-up hypertension and alcohol.  Wetzel was seen a month ago and found to have hypertension.  Started on lisinopril 10 mg.  He notes initial some lightheadedness and dizziness which have improved.  He denies any continued lightheadedness or dizziness.  He feels well with no issues chest pain palpitations shortness of breath.  He tolerates medication well.  He notes his blood pressures at home have been typically in the 120s over 70s.  Additionally he has significantly cut back on alcohol use.  He notes this is resulted in some weight loss intentionally.  He continues to exercise and eat a careful diet.   ROS as above:  Exam:  BP 134/86   Pulse 85   Ht 5\' 11"  (1.803 m)   Wt 218 lb (98.9 kg)   BMI 30.40 kg/m  Wt Readings from Last 5 Encounters:  06/27/18 218 lb (98.9 kg)  06/04/18 223 lb (101.2 kg)  05/16/18 224 lb 14.4 oz (102 kg)  12/18/17 231 lb 2 oz (104.8 kg)  11/14/17 229 lb (103.9 kg)    Gen: Well NAD HEENT: EOMI,  MMM Lungs: Normal work of breathing. CTABL Heart: RRR no MRG Abd: NABS, Soft. Nondistended, Nontender Exts: Brisk capillary refill, warm and well perfused.      Assessment and Plan: 37 y.o. male with  Hypertension: Blood pressure much improved.  Continue lisinopril.  Check metabolic panel and lipid panel today.  Recheck in 6 months or sooner if needed.  Alcohol use: Significant reduction.  Continue watchful waiting.  Recheck 6 months.   No orders of the defined types were placed in this encounter.  No orders of the defined types were placed in this encounter.    Historical information moved to improve visibility of documentation.  Past Medical History:  Diagnosis Date  . Allergy   . Dizziness   . Hypertension    no medications at this time 09-25-17  . PAC  (premature atrial contraction)   . Palpitations    Past Surgical History:  Procedure Laterality Date  . back cyst resection     Social History   Tobacco Use  . Smoking status: Former Smoker    Last attempt to quit: 11/25/2005    Years since quitting: 12.5  . Smokeless tobacco: Never Used  Substance Use Topics  . Alcohol use: Yes    Alcohol/week: 9.0 standard drinks    Types: 9 Cans of beer per week   family history includes Hepatitis in his mother.  Medications: Current Outpatient Medications  Medication Sig Dispense Refill  . lisinopril (PRINIVIL,ZESTRIL) 10 MG tablet Take 1 tablet (10 mg total) by mouth daily. 90 tablet 0   No current facility-administered medications for this visit.    No Known Allergies   Discussed warning signs or symptoms. Please see discharge instructions. Patient expresses understanding.

## 2018-06-27 NOTE — Patient Instructions (Signed)
Thank you for coming in today. Continue lisinopril 10.  If you continue to lose weight you may cut back on Lisinopril to 5mg  if blood pressure starts to get low.  Let me know if that is the case.  Recheck in 6 months if all is well.  Get labs now.  Return sooner if needed.   Continue to be careful with alcohol.  Cutting back if treating your body well.

## 2018-06-28 LAB — COMPLETE METABOLIC PANEL WITH GFR
AG Ratio: 1.6 (calc) (ref 1.0–2.5)
ALKALINE PHOSPHATASE (APISO): 49 U/L (ref 40–115)
ALT: 14 U/L (ref 9–46)
AST: 20 U/L (ref 10–40)
Albumin: 4.5 g/dL (ref 3.6–5.1)
BUN: 16 mg/dL (ref 7–25)
CALCIUM: 9.1 mg/dL (ref 8.6–10.3)
CO2: 26 mmol/L (ref 20–32)
Chloride: 102 mmol/L (ref 98–110)
Creat: 0.97 mg/dL (ref 0.60–1.35)
GFR, EST NON AFRICAN AMERICAN: 99 mL/min/{1.73_m2} (ref >=60)
GFR, Est African American: 115 mL/min/{1.73_m2} (ref >=60)
GLUCOSE: 97 mg/dL (ref 65–99)
Globulin: 2.8 g/dL (calc) (ref 1.9–3.7)
Potassium: 4.2 mmol/L (ref 3.5–5.3)
SODIUM: 137 mmol/L (ref 135–146)
Total Bilirubin: 0.7 mg/dL (ref 0.2–1.2)
Total Protein: 7.3 g/dL (ref 6.1–8.1)

## 2018-06-28 LAB — LIPID PANEL W/REFLEX DIRECT LDL
CHOLESTEROL: 142 mg/dL (ref <200)
HDL: 48 mg/dL (ref >40)
LDL Cholesterol (Calc): 81 mg/dL (calc)
Non-HDL Cholesterol (Calc): 94 mg/dL (calc) (ref <130)
TRIGLYCERIDES: 54 mg/dL (ref <150)
Total CHOL/HDL Ratio: 3 (calc) (ref <5.0)

## 2018-07-23 DIAGNOSIS — R361 Hematospermia: Secondary | ICD-10-CM | POA: Diagnosis not present

## 2018-08-28 ENCOUNTER — Other Ambulatory Visit: Payer: Self-pay | Admitting: Family Medicine

## 2018-10-12 DIAGNOSIS — J1189 Influenza due to unidentified influenza virus with other manifestations: Secondary | ICD-10-CM | POA: Diagnosis not present

## 2018-11-22 DIAGNOSIS — I10 Essential (primary) hypertension: Secondary | ICD-10-CM | POA: Diagnosis not present

## 2018-12-06 ENCOUNTER — Other Ambulatory Visit: Payer: Self-pay | Admitting: Family Medicine

## 2018-12-27 ENCOUNTER — Ambulatory Visit: Payer: Self-pay | Admitting: Family Medicine

## 2019-01-03 ENCOUNTER — Ambulatory Visit: Payer: Self-pay | Admitting: Family Medicine

## 2019-07-02 ENCOUNTER — Ambulatory Visit (INDEPENDENT_AMBULATORY_CARE_PROVIDER_SITE_OTHER): Payer: 59 | Admitting: Family Medicine

## 2019-07-02 ENCOUNTER — Encounter: Payer: Self-pay | Admitting: Family Medicine

## 2019-07-02 ENCOUNTER — Other Ambulatory Visit: Payer: Self-pay

## 2019-07-02 VITALS — BP 134/76 | HR 56 | Temp 98.1°F | Wt 225.0 lb

## 2019-07-02 DIAGNOSIS — I10 Essential (primary) hypertension: Secondary | ICD-10-CM

## 2019-07-02 DIAGNOSIS — Z6831 Body mass index (BMI) 31.0-31.9, adult: Secondary | ICD-10-CM | POA: Diagnosis not present

## 2019-07-02 DIAGNOSIS — M25512 Pain in left shoulder: Secondary | ICD-10-CM

## 2019-07-02 DIAGNOSIS — Z0000 Encounter for general adult medical examination without abnormal findings: Secondary | ICD-10-CM

## 2019-07-02 DIAGNOSIS — M25511 Pain in right shoulder: Secondary | ICD-10-CM

## 2019-07-02 MED ORDER — LISINOPRIL 10 MG PO TABS: 10.0000 mg | ORAL_TABLET | Freq: Every day | ORAL | 3 refills | Status: DC

## 2019-07-02 NOTE — Progress Notes (Signed)
Spencer Jackson is a 38 y.o. male who presents to [place]: [place] today for well adult visit.   Spencer Jackson is doing pretty well overall.  He is trending exercise regularly which he finds helpful for his overall wellbeing.  He does have a history of hypertension and typically takes lisinopril 10 mg daily but forgot to take it this morning.  He notes he tolerates it well with no chest pain palpitation shortness of breath.  He did have a history of heavier alcohol use but has been doing well recently with very minimal alcohol use.  He notes his mood is doing reasonably well overall as well.  He has been seen previously for shoulder pain and notes that he is having some bilateral shoulder pain and would like to proceed with trial of physical therapy. ROS as above:  Past Medical History:  Diagnosis Date  . Allergy   . Dizziness   . Hypertension    no medications at this time 09-25-17  . PAC (premature atrial contraction)   . Palpitations    Past Surgical History:  Procedure Laterality Date  . back cyst resection     Social History   Tobacco Use  . Smoking status: Former Smoker    Quit date: 11/25/2005    Years since quitting: 13.6  . Smokeless tobacco: Never Used  Substance Use Topics  . Alcohol use: Yes    Alcohol/week: 9.0 standard drinks    Types: 9 Cans of beer per week   family history includes Hepatitis in his mother.  Medications: Current Outpatient Medications  Medication Sig Dispense Refill  . lisinopril (ZESTRIL) 10 MG tablet Take 1 tablet (10 mg total) by mouth daily. 90 tablet 3   No current facility-administered medications for this visit.    No Known Allergies  Health Maintenance Health Maintenance  Topic Date Due  . INFLUENZA VACCINE  12/10/2019 (Originally 04/12/2019)  . COLONOSCOPY  09/25/2022  . TETANUS/TDAP  05/30/2026  . HIV Screening   Completed     Exam:  BP 134/76   Pulse (!) 56   Temp 98.1 F (36.7 C) (Oral)   Wt 225 lb (102.1 kg)   BMI 31.38 kg/m  Wt Readings from Last 5 Encounters:  07/02/19 225 lb (102.1 kg)  06/27/18 218 lb (98.9 kg)  06/04/18 223 lb (101.2 kg)  05/16/18 224 lb 14.4 oz (102 kg)  12/18/17 231 lb 2 oz (104.8 kg)      Gen: Well NAD HEENT: EOMI,  MMM Lungs: Normal work of breathing. CTABL Heart: RRR no MRG Abd: NABS, Soft. Nondistended, Nontender Exts: Brisk capillary refill, warm and well perfused.  Psych: Alert and oriented normal speech thought process and affect.   Depression screen [facility] 2/9 07/02/2019 05/16/2018 06/04/2017 06/04/2017 05/30/2016  Decreased Interest 1 1 0 0 2  Down, Depressed, Hopeless 1 1 0 0 1  PHQ - 2 Score 2 2 0 0 3  Altered sleeping 0 0 2 - 0  Tired, decreased energy 3 3 2  - 2  Change in appetite 0 1 1 - 2  Feeling bad or failure about yourself  0 1 0 - 0  Trouble concentrating 1 2 0 - 1  Moving slowly or fidgety/restless 0 0 0 - 2  Suicidal thoughts 0 0 0 - 0  PHQ-9 Score 6 9 5  - 10  Difficult doing work/chores Somewhat difficult Somewhat difficult - - Very difficult  Some encounter information is  confidential and restricted. Go to Review Flowsheets activity to see all data.    GAD 7 : Generalized Anxiety Score 07/02/2019 06/04/2018 05/16/2018 06/04/2017  Nervous, Anxious, on Edge 0 2 0 0  Control/stop worrying 0 0 0 0  Worry too much - different things 0 0 0 0  Trouble relaxing 0 0 0 0  Restless 0 1 0 0  Easily annoyed or irritable 2 1 1 1   Afraid - awful might happen 0 2 0 0  Total GAD 7 Score 2 6 1 1   Anxiety Difficulty Somewhat difficult Somewhat difficult Not difficult at all -  Some encounter information is confidential and restricted. Go to Review Flowsheets activity to see all data.        Assessment and Plan: 38 y.o. male with  Well adult.  Doing reasonably well.  Discussed exercise and diet strategies.  Plan to get basic fasting labs.  Continue lisinopril.  We will go ahead and refer to physical therapy for shoulder pain if not better return to clinic for reassessment. Continue to be careful regarding alcohol.  Recheck as needed.  Recheck yearly for next physical exam with new PCP.  PDMP not reviewed this encounter. Orders Placed This Encounter  Procedures  . CBC  . COMPLETE METABOLIC PANEL WITH GFR  . Lipid Panel w/reflex Direct LDL  . Ambulatory referral to Physical Therapy    Referral Priority:   Routine    Referral Type:   Physical Medicine    Referral Reason:   Specialty Services Required    Requested Specialty:   Physical Therapy    Number of Visits Requested:   1   Meds ordered this encounter  Medications  . lisinopril (ZESTRIL) 10 MG tablet    Sig: Take 1 tablet (10 mg total) by mouth daily.    Dispense:  90 tablet    Refill:  3     Discussed warning signs or symptoms. Please see discharge instructions. Patient expresses understanding.

## 2019-07-02 NOTE — Patient Instructions (Signed)
Thank you for coming in today. Continue to work on exercise and diet.  Continue lisinopril.  Start PT in [place] or [place].  If not better ok to follow up with me at [facility] in [place] or Dr T here.  Get labs now.  Recheck yearly medically.   I will be moving to full time Sports Medicine in [place] starting on November 2nd  You will still be able to see me for your Sports Medicine or Orthopedic needs at [facility] in [place]. I will still be part of [facility].    If you want to stay locally for your Sports Medicine issues Dr. Dianah Field here in [place] will be happy to see you.  Additionally Dr. Clearance Coots at [facility] will be happy to see you for sports medicine issues more locally.   For your primary care needs you are welcome to establish care with Dr. Emeterio Reeve.  Dr Luetta Nutting (Starting in February) will be starting in the new year and a new NP Joy (Starting in December).  We are working quickly to hire more physicians to cover the primary care needs however if you cannot get an appointment with Dr. Sheppard Coil in a timely manner [facility] has locations and openings for primary care services nearby.   [facility] at [facility] [address] . [place] , [place]: [phone] . Behavioral Medicine: [phone] . Fax: [place] at [employer] [address] . [place], [place]: [phone] . Behavioral Medicine: [phone] . Fax: [phone] . Hours (M-F): 7am - Academic librarian At [facility]. [place], [place]: [phone] . Behavioral Medicine: [phone] . Fax: [phone] . Hours (M-F): 8am - Optician, dispensing at [employer] . [place], [place] Phone: [phone] . Behavioral Medicine:  [phone] . Fax: [phone]

## 2019-07-03 LAB — CBC
HCT: 46.6 % (ref 38.5–50.0)
Hemoglobin: 16 g/dL (ref 13.2–17.1)
MCH: 31.3 pg (ref 27.0–33.0)
MCHC: 34.3 g/dL (ref 32.0–36.0)
MCV: 91 fL (ref 80.0–100.0)
MPV: 9.6 fL (ref 7.5–12.5)
Platelets: 248 10*3/uL (ref 140–400)
RBC: 5.12 10*6/uL (ref 4.20–5.80)
RDW: 12.4 % (ref 11.0–15.0)
WBC: 5.7 10*3/uL (ref 3.8–10.8)

## 2019-07-03 LAB — LIPID PANEL W/REFLEX DIRECT LDL
Cholesterol: 145 mg/dL (ref <200)
HDL: 54 mg/dL (ref >=40)
LDL Cholesterol (Calc): 75 mg/dL (calc)
Non-HDL Cholesterol (Calc): 91 mg/dL (calc) (ref <130)
Total CHOL/HDL Ratio: 2.7 (calc) (ref <5.0)
Triglycerides: 81 mg/dL (ref <150)

## 2019-07-03 LAB — COMPLETE METABOLIC PANEL WITH GFR
AG Ratio: 1.6 (calc) (ref 1.0–2.5)
ALT: 15 U/L (ref 9–46)
AST: 21 U/L (ref 10–40)
Albumin: 4.4 g/dL (ref 3.6–5.1)
Alkaline phosphatase (APISO): 47 U/L (ref 36–130)
BUN: 17 mg/dL (ref 7–25)
CO2: 26 mmol/L (ref 20–32)
Calcium: 9.2 mg/dL (ref 8.6–10.3)
Chloride: 104 mmol/L (ref 98–110)
Creat: 0.9 mg/dL (ref 0.60–1.35)
GFR, Est African American: 125 mL/min/{1.73_m2} (ref >=60)
GFR, Est Non African American: 108 mL/min/{1.73_m2} (ref >=60)
Globulin: 2.7 g/dL (calc) (ref 1.9–3.7)
Glucose, Bld: 89 mg/dL (ref 65–99)
Potassium: 4.7 mmol/L (ref 3.5–5.3)
Sodium: 138 mmol/L (ref 135–146)
Total Bilirubin: 0.5 mg/dL (ref 0.2–1.2)
Total Protein: 7.1 g/dL (ref 6.1–8.1)

## 2020-03-02 ENCOUNTER — Ambulatory Visit: Payer: 59 | Admitting: Nurse Practitioner

## 2020-03-04 ENCOUNTER — Other Ambulatory Visit: Payer: Self-pay | Admitting: Family Medicine

## 2020-03-04 DIAGNOSIS — R1909 Other intra-abdominal and pelvic swelling, mass and lump: Secondary | ICD-10-CM

## 2020-03-05 ENCOUNTER — Ambulatory Visit
Admission: RE | Admit: 2020-03-05 | Discharge: 2020-03-05 | Disposition: A | Discharge status: Home / Self Care | Payer: 59 | Source: Ambulatory Visit | Attending: Family Medicine | Admitting: Family Medicine

## 2020-03-05 DIAGNOSIS — R1909 Other intra-abdominal and pelvic swelling, mass and lump: Secondary | ICD-10-CM

## 2020-06-16 ENCOUNTER — Ambulatory Visit: Payer: 59 | Admitting: Cardiology

## 2020-07-15 ENCOUNTER — Ambulatory Visit: Payer: 59 | Admitting: Cardiology

## 2020-07-16 ENCOUNTER — Ambulatory Visit (INDEPENDENT_AMBULATORY_CARE_PROVIDER_SITE_OTHER)
Admission: RE | Admit: 2020-07-16 | Discharge: 2020-07-16 | Disposition: A | Discharge status: Home / Self Care | Payer: Self-pay | Source: Ambulatory Visit | Attending: Cardiology | Admitting: Cardiology

## 2020-07-16 ENCOUNTER — Encounter: Payer: Self-pay | Admitting: Cardiology

## 2020-07-16 ENCOUNTER — Ambulatory Visit (INDEPENDENT_AMBULATORY_CARE_PROVIDER_SITE_OTHER): Payer: 59 | Admitting: Cardiology

## 2020-07-16 ENCOUNTER — Other Ambulatory Visit: Payer: Self-pay

## 2020-07-16 VITALS — BP 142/94 | HR 50 | Ht 71.0 in | Wt 230.8 lb

## 2020-07-16 DIAGNOSIS — R002 Palpitations: Secondary | ICD-10-CM

## 2020-07-16 DIAGNOSIS — I493 Ventricular premature depolarization: Secondary | ICD-10-CM

## 2020-07-16 DIAGNOSIS — R079 Chest pain, unspecified: Secondary | ICD-10-CM | POA: Diagnosis not present

## 2020-07-16 DIAGNOSIS — U099 Post covid-19 condition, unspecified: Secondary | ICD-10-CM | POA: Diagnosis not present

## 2020-07-16 NOTE — Progress Notes (Signed)
Cardiology Consult Note   Date:  07/16/2020   ID:  Spencer Jackson, DOB 02/08/1981, MRN [id number]  PCP:  Patient, No Pcp Per    Chief Complaint  Patient presents with  . New Patient (Initial Visit)    Palpitations      History of Present Illness: Spencer Jackson is a 39 y.o. male who presents for evaluation of in referral by    He has a history of HTN, palpitations with PACs on EKG and dizziness in the past.  He had an echo in [place] showing normal LVF with mild LAE and mild MR. It was felt that he may have post prandial hypotension and it was recommended that he avoid ETOH at lunch and do lighter meals.   He does not smoke currently but did smoke and quit 10 year ago.  He drinks a few times weekly 2-4 alcoholic drinks at time as well as 6 shots espresso daily.   He is here today for evaluation of chest pain and palpitations. He occasionally will notice a skipped heart beat reminiscent of his prior PACs and PVCs and the frequency and severity have not changed since 2017.  He denies any SOB, DOE, PND, orthopnea, LE edema, dizziness, or syncope. He occasionally has some pressure on his chest that he thinks might be gas and if he moves around it will go away. The discomfort is nonexertional and no associated sx.  He is compliant with his meds and is tolerating meds with no SE.  He also had COVID 19 in August and is worried about the effect on his LVF.  Past Medical History:  Diagnosis Date  . Allergy   . Dizziness   . Hypertension    no medications at this time 09-25-17  . PAC (premature atrial contraction)   . PVC's (premature ventricular contractions)     Past Surgical History:  Procedure Laterality Date  . back cyst resection       Current Outpatient Medications  Medication Sig Dispense Refill  . lisinopril (ZESTRIL) 10 MG tablet Take 1 tablet (10 mg total) by mouth daily. 90 tablet 3   No current facility-administered medications for this visit.    Allergies:    Patient has no known allergies.    Social History:  The patient  reports that he quit smoking about 14 years ago. He has never used smokeless tobacco. He reports current alcohol use of about 9.0 standard drinks of alcohol per week. He reports that he does not use drugs.   Family History:  The patient's family history includes Hepatitis in his mother.    ROS:  Please see the history of present illness.   Otherwise, review of systems are positive for none.   All other systems are reviewed and negative.    PHYSICAL EXAM: VS:  BP (!) 142/94   Pulse (!) 50   Ht 5\' 11"  (1.803 m)   Wt 230 lb 12.8 oz (104.7 kg)   BMI 32.19 kg/m  , BMI Body mass index is 32.19 kg/m. GEN: Well nourished, well developed in no acute distress HEENT: Normal NECK: No JVD; No carotid bruits LYMPHATICS: No lymphadenopathy CARDIAC:RRR, no murmurs, rubs, gallops RESPIRATORY:  Clear to auscultation without rales, wheezing or rhonchi  ABDOMEN: Soft, non-tender, non-distended MUSCULOSKELETAL:  No edema; No deformity  SKIN: Warm and dry NEUROLOGIC:  Alert and oriented x 3 PSYCHIATRIC:  Normal affect  EKG:  EKG is ordered today. The ekg ordered today demonstrates sinus bradycardia at 50bpm and no ST changes   Recent Labs: No results found for requested labs within last 8760 hours.    Lipid Panel    Component Value Date/Time   CHOL 145 07/02/2019 1010   TRIG 81 07/02/2019 1010   HDL 54 07/02/2019 1010   CHOLHDL 2.7 07/02/2019 1010   VLDL 17 05/30/2016 0954   LDLCALC 75 07/02/2019 1010      Wt Readings from Last 3 Encounters:  07/16/20 230 lb 12.8 oz (104.7 kg)  07/02/19 225 lb (102.1 kg)  06/27/18 218 lb (98.9 kg)    ASSESSMENT AND PLAN:  1.  Palpitations -the palpitations are no different than they were in 2017 at which time a heart monitor showed PACs and PVCs.  -he drinks 6 shots of espresso daily along with significant ETOH intake and I recommended that he cut out caffeine and cut back on  ETOH use. -cannot use BB or CCB for suppression due to resting bradycardia  2.  HTN  -BP borderline controlled on exam today -at home his BP runs 130/80's -continue on Lisinopril 10mg  daily -encouraged him to limit Na to < 2gm daily  3.  Chest pain -this is atypical and unlikely cardiac -will repeat an ETT and coronary Ca score -check 2D echo due to recent COVID 19 infection  Current medicines are reviewed at length with the patient today.  The patient does not have concerns regarding medicines.  The following changes have been made:  no change  Labs/ tests ordered today: See above Assessment and Plan  Orders Placed This Encounter  Procedures  . EKG 12-Lead     Disposition:   FU with me in 1 year  Signed, Fransico Him, MD  07/16/2020 11:27 AM    [facility] [place], [place], [place]  [address] Phone: [phone]; Fax: [phone]

## 2020-07-16 NOTE — Patient Instructions (Signed)
Medication Instructions:  Your physician recommends that you continue on your current medications as directed. Please refer to the Current Medication list given to you today.   Labwork: None ordered.   Testing/Procedures: Your physician has requested that you have Calcium Scoring CT. Cardiac computed tomography (CT) is a painless test that uses an x-ray machine to take clear, detailed pictures of your heart. For further information please visit [email or web address].   Your physician has requested that you have en exercise stress test. For further information please visit [email or web address]. Please follow instruction sheet, as given.  Your physician has requested that you have an echocardiogram. Echocardiography is a painless test that uses sound waves to create images of your heart. It provides your doctor with information about the size and shape of your heart and how well your heart's chambers and valves are working. This procedure takes approximately one hour. There are no restrictions for this procedure.   Follow-Up: Your physician recommends that you schedule a follow-up appointment in:   12 months with Dr. Radford Pax  Any Other Special Instructions Will Be Listed Below (If Applicable).  Please try to reduce your sodium intake to less than 2000mg  (2g) per day. Try and limit your alcohol intake to 2 or less drinks per day.    If you need a refill on your cardiac medications before your next appointment, please call your pharmacy.   Low-Sodium Eating Plan Sodium, which is an element that makes up salt, helps you maintain a healthy balance of fluids in your body. Too much sodium can increase your blood pressure and cause fluid and waste to be held in your body. Your health care provider or dietitian may recommend following this plan if you have high blood pressure (hypertension), kidney disease, liver disease, or heart failure. Eating less sodium can help lower your blood pressure,  reduce swelling, and protect your heart, liver, and kidneys. What are tips for following this plan? General guidelines  Most people on this plan should limit their sodium intake to 1,500-2,000 mg (milligrams) of sodium each day. Reading food labels   The Nutrition Facts label lists the amount of sodium in one serving of the food. If you eat more than one serving, you must multiply the listed amount of sodium by the number of servings.  Choose foods with less than 140 mg of sodium per serving.  Avoid foods with 300 mg of sodium or more per serving. Shopping  Look for lower-sodium products, often labeled as "low-sodium" or "no salt added."  Always check the sodium content even if foods are labeled as "unsalted" or "no salt added".  Buy fresh foods. ? Avoid canned foods and premade or frozen meals. ? Avoid canned, cured, or processed meats  Buy breads that have less than 80 mg of sodium per slice. Cooking  Eat more home-cooked food and less restaurant, buffet, and fast food.  Avoid adding salt when cooking. Use salt-free seasonings or herbs instead of table salt or sea salt. Check with your health care provider or pharmacist before using salt substitutes.  Cook with plant-based oils, such as canola, sunflower, or olive oil. Meal planning  When eating at a restaurant, ask that your food be prepared with less salt or no salt, if possible.  Avoid foods that contain MSG (monosodium glutamate). MSG is sometimes added to [place] food, bouillon, and some canned foods. What foods are recommended? The items listed may not be a complete list. Talk with your dietitian about what dietary choices are  best for you. Grains Low-sodium cereals, including oats, puffed wheat and rice, and shredded wheat. Low-sodium crackers. Unsalted rice. Unsalted pasta. Low-sodium bread. Whole-grain breads and whole-grain pasta. Vegetables Fresh or frozen vegetables. "No salt added" canned vegetables. "No salt  added" tomato sauce and paste. Low-sodium or reduced-sodium tomato and vegetable juice. Fruits Fresh, frozen, or canned fruit. Fruit juice. Meats and other protein foods Fresh or frozen (no salt added) meat, poultry, seafood, and fish. Low-sodium canned tuna and salmon. Unsalted nuts. Dried peas, beans, and lentils without added salt. Unsalted canned beans. Eggs. Unsalted nut butters. Dairy Milk. Soy milk. Cheese that is naturally low in sodium, such as ricotta cheese, fresh mozzarella, or Swiss cheese Low-sodium or reduced-sodium cheese. Cream cheese. Yogurt. Fats and oils Unsalted butter. Unsalted margarine with no trans fat. Vegetable oils such as canola or olive oils. Seasonings and other foods Fresh and dried herbs and spices. Salt-free seasonings. Low-sodium mustard and ketchup. Sodium-free salad dressing. Sodium-free light mayonnaise. Fresh or refrigerated horseradish. Lemon juice. Vinegar. Homemade, reduced-sodium, or low-sodium soups. Unsalted popcorn and pretzels. Low-salt or salt-free chips. What foods are not recommended? The items listed may not be a complete list. Talk with your dietitian about what dietary choices are best for you. Grains Instant hot cereals. Bread stuffing, pancake, and biscuit mixes. Croutons. Seasoned rice or pasta mixes. Noodle soup cups. Boxed or frozen macaroni and cheese. Regular salted crackers. Self-rising flour. Vegetables Sauerkraut, pickled vegetables, and relishes. Olives. [place] fries. Onion rings. Regular canned vegetables (not low-sodium or reduced-sodium). Regular canned tomato sauce and paste (not low-sodium or reduced-sodium). Regular tomato and vegetable juice (not low-sodium or reduced-sodium). Frozen vegetables in sauces. Meats and other protein foods Meat or fish that is salted, canned, smoked, spiced, or pickled. Bacon, ham, sausage, hotdogs, corned beef, chipped beef, packaged lunch meats, salt pork, jerky, pickled herring, anchovies, regular  canned tuna, sardines, salted nuts. Dairy Processed cheese and cheese spreads. Cheese curds. Blue cheese. Feta cheese. String cheese. Regular cottage cheese. Buttermilk. Canned milk. Fats and oils Salted butter. Regular margarine. Ghee. Bacon fat. Seasonings and other foods Onion salt, garlic salt, seasoned salt, table salt, and sea salt. Canned and packaged gravies. Worcestershire sauce. Tartar sauce. Barbecue sauce. Teriyaki sauce. Soy sauce, including reduced-sodium. Steak sauce. Fish sauce. Oyster sauce. Cocktail sauce. Horseradish that you find on the shelf. Regular ketchup and mustard. Meat flavorings and tenderizers. Bouillon cubes. Hot sauce and Tabasco sauce. Premade or packaged marinades. Premade or packaged taco seasonings. Relishes. Regular salad dressings. Salsa. Potato and tortilla chips. Corn chips and puffs. Salted popcorn and pretzels. Canned or dried soups. Pizza. Frozen entrees and pot pies. Summary  Eating less sodium can help lower your blood pressure, reduce swelling, and protect your heart, liver, and kidneys.  Most people on this plan should limit their sodium intake to 1,500-2,000 mg (milligrams) of sodium each day.  Canned, boxed, and frozen foods are high in sodium. Restaurant foods, fast foods, and pizza are also very high in sodium. You also get sodium by adding salt to food.  Try to cook at home, eat more fresh fruits and vegetables, and eat less fast food, canned, processed, or prepared foods. This information is not intended to replace advice given to you by your health care provider. Make sure you discuss any questions you have with your health care provider. Document Revised: 08/10/2017 Document Reviewed: 08/21/2016 Elsevier Patient Education  2020 [employer].

## 2020-08-06 ENCOUNTER — Other Ambulatory Visit (HOSPITAL_COMMUNITY)
Admission: RE | Admit: 2020-08-06 | Discharge: 2020-08-06 | Disposition: A | Discharge status: Home / Self Care | Payer: 59 | Source: Ambulatory Visit | Attending: Cardiology | Admitting: Cardiology

## 2020-08-06 DIAGNOSIS — Z01818 Encounter for other preprocedural examination: Secondary | ICD-10-CM | POA: Diagnosis present

## 2020-08-06 DIAGNOSIS — Z20822 Contact with and (suspected) exposure to covid-19: Secondary | ICD-10-CM | POA: Insufficient documentation

## 2020-08-06 LAB — SARS CORONAVIRUS 2 (TAT 6-24 HRS): SARS Coronavirus 2: NEGATIVE

## 2020-08-10 ENCOUNTER — Ambulatory Visit (INDEPENDENT_AMBULATORY_CARE_PROVIDER_SITE_OTHER): Payer: 59

## 2020-08-10 ENCOUNTER — Other Ambulatory Visit: Payer: Self-pay

## 2020-08-10 ENCOUNTER — Ambulatory Visit (HOSPITAL_COMMUNITY): Payer: 59 | Attending: Cardiology

## 2020-08-10 DIAGNOSIS — R002 Palpitations: Secondary | ICD-10-CM

## 2020-08-10 DIAGNOSIS — R079 Chest pain, unspecified: Secondary | ICD-10-CM | POA: Diagnosis not present

## 2020-08-10 DIAGNOSIS — I493 Ventricular premature depolarization: Secondary | ICD-10-CM

## 2020-08-10 DIAGNOSIS — U099 Post covid-19 condition, unspecified: Secondary | ICD-10-CM | POA: Insufficient documentation

## 2020-08-10 LAB — EXERCISE TOLERANCE TEST
Estimated workload: 13.4 METS
Exercise duration (min): 10 min
Exercise duration (sec): 0 s
MPHR: 181 {beats}/min
Peak HR: 164 {beats}/min
Percent HR: 90 %
RPE: 17
Rest HR: 57 {beats}/min

## 2020-08-10 LAB — ECHOCARDIOGRAM COMPLETE
Area-P 1/2: 3.03 cm2
S' Lateral: 3.6 cm

## 2020-08-16 ENCOUNTER — Encounter: Payer: Self-pay | Admitting: Family Medicine

## 2020-08-16 ENCOUNTER — Ambulatory Visit (INDEPENDENT_AMBULATORY_CARE_PROVIDER_SITE_OTHER): Payer: 59 | Admitting: Family Medicine

## 2020-08-16 VITALS — BP 137/95 | HR 50 | Temp 98.1°F | Wt 233.8 lb

## 2020-08-16 DIAGNOSIS — I10 Essential (primary) hypertension: Secondary | ICD-10-CM

## 2020-08-16 DIAGNOSIS — I493 Ventricular premature depolarization: Secondary | ICD-10-CM

## 2020-08-16 DIAGNOSIS — Z23 Encounter for immunization: Secondary | ICD-10-CM

## 2020-08-16 DIAGNOSIS — Z0000 Encounter for general adult medical examination without abnormal findings: Secondary | ICD-10-CM

## 2020-08-16 DIAGNOSIS — R5383 Other fatigue: Secondary | ICD-10-CM

## 2020-08-16 MED ORDER — LISINOPRIL 10 MG PO TABS
10.0000 mg | ORAL_TABLET | Freq: Every day | ORAL | 3 refills | Status: DC
Start: 2020-08-16 — End: 2021-07-15

## 2020-08-16 NOTE — Assessment & Plan Note (Signed)
BP mildly elevated today.  Restart lisinopril.

## 2020-08-16 NOTE — Assessment & Plan Note (Signed)
Well adult Orders Placed This Encounter  Procedures  . Flu Vaccine QUAD 6+ mos PF IM (Fluarix Quad PF)  . COMPLETE METABOLIC PANEL WITH GFR  . CBC  . Lipid Profile  . TSH  . Testosterone  Screening:  Lipid panel.  Immunization:  Flu vaccine given today.  Anticipatory guidance/Risk factor reduction:  Counseled on continuing to reduce EtOH intake, healthy diet and regular exercise. Additional recommendations per AVS.

## 2020-08-16 NOTE — Progress Notes (Signed)
Spencer Jackson - 39 y.o. male MRN [id number]  Date of birth: [birth year]  Subjective Chief Complaint  Patient presents with  . Annual Exam    HPI Spencer Jackson is a 39 y.o. male with history of HTN and mild alcohol use d/o here today for annual exam.  Reports some fatigue, worsened as the day goes on.    He has been seeing cardiology as well for palpitations and had Echo and stress test recently which were normal.    He has been off of lisinopril for a little over 1 week.  He has done well with this without side effects.   He is a non-smoker.  He is working on cutting back on alcohol intake.  Currently drinking about 3 beers, 4 days per week.   He does exercise some and feels like his diet is pretty good.   Review of Systems  Constitutional: Negative for chills, fever, malaise/fatigue and weight loss.  HENT: Negative for congestion, ear pain and sore throat.   Eyes: Negative for blurred vision, double vision and pain.  Respiratory: Negative for cough and shortness of breath.   Cardiovascular: Negative for chest pain and palpitations.  Gastrointestinal: Negative for abdominal pain, blood in stool, constipation, heartburn and nausea.  Genitourinary: Negative for dysuria and urgency.  Musculoskeletal: Negative for joint pain and myalgias.  Neurological: Negative for dizziness and headaches.  Endo/Heme/Allergies: Does not bruise/bleed easily.  Psychiatric/Behavioral: Negative for depression. The patient is not nervous/anxious and does not have insomnia.     No Known Allergies  Past Medical History:  Diagnosis Date  . Allergy   . Dizziness   . Hypertension    no medications at this time 09-25-17  . PAC (premature atrial contraction)   . PVC's (premature ventricular contractions)     Past Surgical History:  Procedure Laterality Date  . back cyst resection      Social History   Socioeconomic History  . Marital status: Married    Spouse name: Not on file  . Number of  children: Not on file  . Years of education: Not on file  . Highest education level: Not on file  Occupational History  . Not on file  Tobacco Use  . Smoking status: Former Smoker    Quit date: 11/25/2005    Years since quitting: 14.7  . Smokeless tobacco: Never Used  Vaping Use  . Vaping Use: Never used  Substance and Sexual Activity  . Alcohol use: Yes    Alcohol/week: 9.0 standard drinks    Types: 9 Cans of beer per week  . Drug use: No  . Sexual activity: Not on file  Other Topics Concern  . Not on file  Social History Narrative  . Not on file   Social Determinants of Health   Financial Resource Strain:   . Difficulty of Paying Living Expenses: Not on file  Food Insecurity:   . Worried About Charity fundraiser in the Last Year: Not on file  . Ran Out of Food in the Last Year: Not on file  Transportation Needs:   . Lack of Transportation (Medical): Not on file  . Lack of Transportation (Non-Medical): Not on file  Physical Activity:   . Days of Exercise per Week: Not on file  . Minutes of Exercise per Session: Not on file  Stress:   . Feeling of Stress : Not on file  Social Connections:   . Frequency of Communication with Friends and Family: Not on file  .  Frequency of Social Gatherings with Friends and Family: Not on file  . Attends Religious Services: Not on file  . Active Member of Clubs or Organizations: Not on file  . Attends Archivist Meetings: Not on file  . Marital Status: Not on file    Family History  Problem Relation Age of Onset  . Hepatitis Mother        autoimmune  . Colon cancer Neg Hx   . Esophageal cancer Neg Hx   . Rectal cancer Neg Hx   . Stomach cancer Neg Hx     Health Maintenance  Topic Date Due  . Hepatitis C Screening  Never done  . INFLUENZA VACCINE  04/11/2020  . COLONOSCOPY  09/25/2022  . TETANUS/TDAP  05/30/2026  . COVID-19 Vaccine  Completed  . HIV Screening  Completed      ----------------------------------------------------------------------------------------------------------------------------------------------------------------------------------------------------------------- Physical Exam BP (!) 137/95 (BP Location: Left Arm, Patient Position: Sitting, Cuff Size: Large)   Pulse (!) 50   Temp 98.1 F (36.7 C)   Wt 233 lb 12.8 oz (106.1 kg)   SpO2 100%   BMI 32.61 kg/m   Physical Exam Constitutional:      General: He is not in acute distress. HENT:     Head: Normocephalic and atraumatic.     Right Ear: Tympanic membrane and external ear normal.     Left Ear: Tympanic membrane and external ear normal.     Mouth/Throat:     Mouth: Mucous membranes are moist.  Eyes:     General: No scleral icterus. Neck:     Thyroid: No thyromegaly.  Cardiovascular:     Rate and Rhythm: Normal rate and regular rhythm.     Heart sounds: Normal heart sounds.  Pulmonary:     Effort: Pulmonary effort is normal.     Breath sounds: Normal breath sounds.  Abdominal:     General: Bowel sounds are normal. There is no distension.     Palpations: Abdomen is soft.     Tenderness: There is no abdominal tenderness. There is no guarding.  Musculoskeletal:     Cervical back: Normal range of motion.  Lymphadenopathy:     Cervical: No cervical adenopathy.  Skin:    General: Skin is warm and dry.     Findings: No rash.  Neurological:     General: No focal deficit present.     Mental Status: He is alert and oriented to person, place, and time.     Cranial Nerves: No cranial nerve deficit.     Motor: No abnormal muscle tone.  Psychiatric:        Mood and Affect: Mood normal.        Behavior: Behavior normal.     ------------------------------------------------------------------------------------------------------------------------------------------------------------------------------------------------------------------- Assessment and Plan  Hypertension BP  mildly elevated today.  Restart lisinopril.   Well adult exam Well adult Orders Placed This Encounter  Procedures  . Flu Vaccine QUAD 6+ mos PF IM (Fluarix Quad PF)  . COMPLETE METABOLIC PANEL WITH GFR  . CBC  . Lipid Profile  . TSH  . Testosterone  Screening:  Lipid panel.  Immunization:  Flu vaccine given today.  Anticipatory guidance/Risk factor reduction:  Counseled on continuing to reduce EtOH intake, healthy diet and regular exercise. Additional recommendations per AVS.    Meds ordered this encounter  Medications  . lisinopril (ZESTRIL) 10 MG tablet    Sig: Take 1 tablet (10 mg total) by mouth daily.    Dispense:  90 tablet  Refill:  3    No follow-ups on file.    This visit occurred during the SARS-CoV-2 public health emergency.  Safety protocols were in place, including screening questions prior to the visit, additional usage of staff PPE, and extensive cleaning of exam room while observing appropriate contact time as indicated for disinfecting solutions.

## 2020-08-16 NOTE — Patient Instructions (Signed)
Preventive Care 19-39 Years Old, Male Preventive care refers to lifestyle choices and visits with your health care provider that can promote health and wellness. This includes:  A yearly physical exam. This is also called an annual well check.  Regular dental and eye exams.  Immunizations.  Screening for certain conditions.  Healthy lifestyle choices, such as eating a healthy diet, getting regular exercise, not using drugs or products that contain nicotine and tobacco, and limiting alcohol use. What can I expect for my preventive care visit? Physical exam Your health care provider will check:  Height and weight. These may be used to calculate body mass index (BMI), which is a measurement that tells if you are at a healthy weight.  Heart rate and blood pressure.  Your skin for abnormal spots. Counseling Your health care provider may ask you questions about:  Alcohol, tobacco, and drug use.  Emotional well-being.  Home and relationship well-being.  Sexual activity.  Eating habits.  Work and work Statistician. What immunizations do I need?  Influenza (flu) vaccine  This is recommended every year. Tetanus, diphtheria, and pertussis (Tdap) vaccine  You may need a Td booster every 10 years. Varicella (chickenpox) vaccine  You may need this vaccine if you have not already been vaccinated. Human papillomavirus (HPV) vaccine  If recommended by your health care provider, you may need three doses over 6 months. Measles, mumps, and rubella (MMR) vaccine  You may need at least one dose of MMR. You may also need a second dose. Meningococcal conjugate (MenACWY) vaccine  One dose is recommended if you are 45-76 years old and a Market researcher living in a residence hall, or if you have one of several medical conditions. You may also need additional booster doses. Pneumococcal conjugate (PCV13) vaccine  You may need this if you have certain conditions and were not  previously vaccinated. Pneumococcal polysaccharide (PPSV23) vaccine  You may need one or two doses if you smoke cigarettes or if you have certain conditions. Hepatitis A vaccine  You may need this if you have certain conditions or if you travel or work in places where you may be exposed to hepatitis A. Hepatitis B vaccine  You may need this if you have certain conditions or if you travel or work in places where you may be exposed to hepatitis B. Haemophilus influenzae type b (Hib) vaccine  You may need this if you have certain risk factors. You may receive vaccines as individual doses or as more than one vaccine together in one shot (combination vaccines). Talk with your health care provider about the risks and benefits of combination vaccines. What tests do I need? Blood tests  Lipid and cholesterol levels. These may be checked every 5 years starting at age 17.  Hepatitis C test.  Hepatitis B test. Screening   Diabetes screening. This is done by checking your blood sugar (glucose) after you have not eaten for a while (fasting).  Sexually transmitted disease (STD) testing. Talk with your health care provider about your test results, treatment options, and if necessary, the need for more tests. Follow these instructions at home: Eating and drinking   Eat a diet that includes fresh fruits and vegetables, whole grains, lean protein, and low-fat dairy products.  Take vitamin and mineral supplements as recommended by your health care provider.  Do not drink alcohol if your health care provider tells you not to drink.  If you drink alcohol: ? Limit how much you have to 0-2  drinks a day. ? Be aware of how much alcohol is in your drink. In the [place] one drink equals one 12 oz bottle of beer (355 mL), one 5 oz glass of wine (148 mL), or one 1 oz glass of hard liquor (44 mL). Lifestyle  Take daily care of your teeth and gums.  Stay active. Exercise for at least 30 minutes on 5 or  more days each week.  Do not use any products that contain nicotine or tobacco, such as cigarettes, e-cigarettes, and chewing tobacco. If you need help quitting, ask your health care provider.  If you are sexually active, practice safe sex. Use a condom or other form of protection to prevent STIs (sexually transmitted infections). What's next?  Go to your health care provider once a year for a well check visit.  Ask your health care provider how often you should have your eyes and teeth checked.  Stay up to date on all vaccines. This information is not intended to replace advice given to you by your health care provider. Make sure you discuss any questions you have with your health care provider. Document Revised: 08/22/2018 Document Reviewed: 08/22/2018 Elsevier Patient Education  2020 [employer].

## 2020-08-17 LAB — TSH: TSH: 1.76 mIU/L (ref 0.40–4.50)

## 2020-08-17 LAB — CBC
HCT: 45.5 % (ref 38.5–50.0)
Hemoglobin: 15.4 g/dL (ref 13.2–17.1)
MCH: 30.6 pg (ref 27.0–33.0)
MCHC: 33.8 g/dL (ref 32.0–36.0)
MCV: 90.3 fL (ref 80.0–100.0)
MPV: 9.7 fL (ref 7.5–12.5)
Platelets: 234 10*3/uL (ref 140–400)
RBC: 5.04 10*6/uL (ref 4.20–5.80)
RDW: 12.4 % (ref 11.0–15.0)
WBC: 5.4 10*3/uL (ref 3.8–10.8)

## 2020-08-17 LAB — COMPLETE METABOLIC PANEL WITH GFR
AG Ratio: 1.7 (calc) (ref 1.0–2.5)
ALT: 18 U/L (ref 9–46)
AST: 23 U/L (ref 10–40)
Albumin: 4.4 g/dL (ref 3.6–5.1)
Alkaline phosphatase (APISO): 48 U/L (ref 36–130)
BUN: 17 mg/dL (ref 7–25)
CO2: 29 mmol/L (ref 20–32)
Calcium: 9.1 mg/dL (ref 8.6–10.3)
Chloride: 103 mmol/L (ref 98–110)
Creat: 0.96 mg/dL (ref 0.60–1.35)
GFR, Est African American: 115 mL/min/{1.73_m2} (ref >=60)
GFR, Est Non African American: 99 mL/min/{1.73_m2} (ref >=60)
Globulin: 2.6 g/dL (calc) (ref 1.9–3.7)
Glucose, Bld: 94 mg/dL (ref 65–99)
Potassium: 4.6 mmol/L (ref 3.5–5.3)
Sodium: 138 mmol/L (ref 135–146)
Total Bilirubin: 0.6 mg/dL (ref 0.2–1.2)
Total Protein: 7 g/dL (ref 6.1–8.1)

## 2020-08-17 LAB — LIPID PANEL
Cholesterol: 164 mg/dL (ref <200)
HDL: 56 mg/dL (ref >=40)
LDL Cholesterol (Calc): 95 mg/dL (calc)
Non-HDL Cholesterol (Calc): 108 mg/dL (calc) (ref <130)
Total CHOL/HDL Ratio: 2.9 (calc) (ref <5.0)
Triglycerides: 52 mg/dL (ref <150)

## 2020-08-17 LAB — TESTOSTERONE: Testosterone: 486 ng/dL (ref 250–827)

## 2020-10-13 ENCOUNTER — Ambulatory Visit: Payer: 59 | Admitting: Family Medicine

## 2020-10-14 ENCOUNTER — Ambulatory Visit (INDEPENDENT_AMBULATORY_CARE_PROVIDER_SITE_OTHER): Payer: 59 | Admitting: Family Medicine

## 2020-10-14 ENCOUNTER — Encounter: Payer: Self-pay | Admitting: Family Medicine

## 2020-10-14 ENCOUNTER — Other Ambulatory Visit: Payer: Self-pay

## 2020-10-14 VITALS — BP 125/77 | HR 52 | Temp 98.2°F | Wt 230.1 lb

## 2020-10-14 DIAGNOSIS — R1011 Right upper quadrant pain: Secondary | ICD-10-CM | POA: Diagnosis not present

## 2020-10-14 NOTE — Patient Instructions (Signed)
I have ordered an ultrasound of the liver and gallbladder.  We'll discuss next steps after you have this completed.

## 2020-10-14 NOTE — Progress Notes (Signed)
Spencer Jackson - 40 y.o. male MRN [id number]  Date of birth: 02-22-1981  Subjective Chief Complaint  Patient presents with  . Abdominal Pain    HPI Spencer Jackson is a 40 y.o. male here today with complaint of RUQ discomfort.  This comes and goes and he first noticed about 2-3 weeks ago.  Symptoms worse after eating.  Sometimes radiates to the back.  Denies nausea, fever, reflux or changes to bowels.   ROS:  A comprehensive ROS was completed and negative except as noted per HPI  No Known Allergies  Past Medical History:  Diagnosis Date  . Allergy   . Dizziness   . Hypertension    no medications at this time 09-25-17  . PAC (premature atrial contraction)   . PVC's (premature ventricular contractions)     Past Surgical History:  Procedure Laterality Date  . back cyst resection      Social History   Socioeconomic History  . Marital status: Married    Spouse name: Not on file  . Number of children: Not on file  . Years of education: Not on file  . Highest education level: Not on file  Occupational History  . Not on file  Tobacco Use  . Smoking status: Former Smoker    Quit date: 11/25/2005    Years since quitting: 14.8  . Smokeless tobacco: Never Used  Vaping Use  . Vaping Use: Never used  Substance and Sexual Activity  . Alcohol use: Yes    Alcohol/week: 9.0 standard drinks    Types: 9 Cans of beer per week  . Drug use: No  . Sexual activity: Not on file  Other Topics Concern  . Not on file  Social History Narrative  . Not on file   Social Determinants of Health   Financial Resource Strain: Not on file  Food Insecurity: Not on file  Transportation Needs: Not on file  Physical Activity: Not on file  Stress: Not on file  Social Connections: Not on file    Family History  Problem Relation Age of Onset  . Hepatitis Mother        autoimmune  . Colon cancer Neg Hx   . Esophageal cancer Neg Hx   . Rectal cancer Neg Hx   . Stomach cancer Neg Hx     Health  Maintenance  Topic Date Due  . Hepatitis C Screening  Never done  . COVID-19 Vaccine (3 - Booster for Pfizer series) 06/22/2020  . COLONOSCOPY (Pts 45-47yrs Insurance coverage will need to be confirmed)  09/25/2022  . TETANUS/TDAP  05/30/2026  . INFLUENZA VACCINE  Completed  . HIV Screening  Completed     ----------------------------------------------------------------------------------------------------------------------------------------------------------------------------------------------------------------- Physical Exam BP 125/77 (BP Location: Left Arm, Patient Position: Sitting, Cuff Size: Large)   Pulse (!) 52   Temp 98.2 F (36.8 C)   Wt 230 lb 1.6 oz (104.4 kg)   SpO2 100%   BMI 32.09 kg/m   Physical Exam Constitutional:      Appearance: He is well-developed.  HENT:     Head: Normocephalic and atraumatic.  Eyes:     General: No scleral icterus. Cardiovascular:     Rate and Rhythm: Normal rate and regular rhythm.  Pulmonary:     Effort: Pulmonary effort is normal.     Breath sounds: Normal breath sounds.  Abdominal:     General: Abdomen is flat. There is no distension.     Palpations: Abdomen is soft.     Tenderness: There  is no abdominal tenderness. There is no guarding.  Musculoskeletal:     Cervical back: Neck supple.  Neurological:     General: No focal deficit present.     Mental Status: He is alert.  Psychiatric:        Mood and Affect: Mood normal.        Behavior: Behavior normal.     ------------------------------------------------------------------------------------------------------------------------------------------------------------------------------------------------------------------- Assessment and Plan  RUQ discomfort History of alcohol use d/o and mother with history of autoimmune hepatitis.  Normal LFT's 08/2020.  Discussed possibility of gallbladder disease.  Will check RUQ [place]   No orders of the defined types were placed in this  encounter.   No follow-ups on file.    This visit occurred during the SARS-CoV-2 public health emergency.  Safety protocols were in place, including screening questions prior to the visit, additional usage of staff PPE, and extensive cleaning of exam room while observing appropriate contact time as indicated for disinfecting solutions.

## 2020-10-14 NOTE — Assessment & Plan Note (Signed)
History of alcohol use d/o and mother with history of autoimmune hepatitis.  Normal LFT's 08/2020.  Discussed possibility of gallbladder disease.  Will check RUQ [place]

## 2020-10-15 ENCOUNTER — Other Ambulatory Visit: Payer: 59

## 2020-10-15 ENCOUNTER — Ambulatory Visit (INDEPENDENT_AMBULATORY_CARE_PROVIDER_SITE_OTHER): Payer: 59

## 2020-10-15 DIAGNOSIS — R1011 Right upper quadrant pain: Secondary | ICD-10-CM | POA: Diagnosis not present

## 2020-10-18 ENCOUNTER — Encounter: Payer: Self-pay | Admitting: Family Medicine

## 2020-10-19 ENCOUNTER — Other Ambulatory Visit: Payer: Self-pay | Admitting: Family Medicine

## 2020-10-19 DIAGNOSIS — F411 Generalized anxiety disorder: Secondary | ICD-10-CM

## 2020-10-27 ENCOUNTER — Other Ambulatory Visit: Payer: Self-pay

## 2020-10-27 ENCOUNTER — Ambulatory Visit (INDEPENDENT_AMBULATORY_CARE_PROVIDER_SITE_OTHER): Payer: 59 | Admitting: Licensed Clinical Social Worker

## 2020-10-27 DIAGNOSIS — F411 Generalized anxiety disorder: Secondary | ICD-10-CM | POA: Diagnosis not present

## 2020-10-27 DIAGNOSIS — F1010 Alcohol abuse, uncomplicated: Secondary | ICD-10-CM | POA: Diagnosis not present

## 2020-10-27 DIAGNOSIS — F330 Major depressive disorder, recurrent, mild: Secondary | ICD-10-CM | POA: Diagnosis not present

## 2020-10-27 NOTE — Progress Notes (Signed)
Comprehensive Clinical Assessment (CCA) Note  10/27/2020 Spencer Jackson [id number]  Visit Diagnosis:        ICD-10-CM    1. Generalized Anxiety Disorder F41.1    2. Alcohol Use Disorder, Mild F10.10    3. Major Depressive Disorder, recurrent, mild   F33.0      CCA Part One   Part One has been completed on paper by the patient.  (See scanned document in Chart Review).   CCA Biopsychosocial Intake/Chief Complaint:  Spencer Jackson reported that he has been experiencing increased anxiety in recent months and was referred for therapy by PCP.  Current Symptoms/Problems: Spencer Jackson reported that he has been dealing with increased anxiety related to stress from work, his relationship with his wife, and worrying about alcohol use, as he drinks x3-4 times per week, and has history of DWI, in addition to worrying how this substance is affecting his health in the long term.  Spencer Jackson reported that he recently experienced a pain in his side which "Made my mind spiral" since he thought it could indicate liver damage, although doctors have not found any evidence for this.  Spencer Jackson endorsed depressive symptoms which have worsened in recent months as well.  Spencer Jackson reported that he also has mood swings when he "Gets in a funk" and finds it hard to deal with his family at times.  He reported that he can be impulsive and say things he regrets.   Patient Reported Schizophrenia/Schizoaffective Diagnosis in Past: No   Strengths: Spencer Jackson reported that he has a stressful job, but it pays well, and feels like he is moving in the right direction career wise.  He reported stable housing, college education, lives with wife and kids.  Preferences: Spencer Jackson reported that he would like to meet once every 2 weeks in-person.  Abilities: Motivated for treatment   Type of Services Patient Feels are Needed: Individual therapy, not interested in medication or psychiatrist at this time.   Initial Clinical Notes/Concerns: Spencer Jackson is a 40 year  old married caucasian male that presented today for comprehensive clinical assessment in person. He presented on time and was alert, oriented x5, with no evidence or self-report of SI/HI or A/V H.  Spencer Jackson denied any current behavioral medications and is not linked with a psychiatrist.  He admitted to regularl weekly use of alcohol for self-medication.  Spencer Jackson completed nutritional and pain assessments today, indicating no risk for malnutrition, but he does have back pain and has been encouraged to follow up with specialist.  He completed PHQ9, and GAD7 screenings.  Spencer Jackson denied any history of SI, attempts, or self-harm, but is agreeable to voluntary hospitalization should thoughts arise with intent and/or plan.   Mental Health Symptoms Depression:  Irritability; Fatigue; Difficulty Concentrating; Worthlessness Spencer Jackson reported that he has struggled with symptoms on and off for roughly a year.)   Duration of Depressive symptoms: Greater than two weeks   Mania:  N/A   Anxiety:   Irritability; Difficulty concentrating; Fatigue; Restlessness; Tension; Worrying   Psychosis:  None   Duration of Psychotic symptoms: No data recorded  Trauma:  N/A   Obsessions:  N/A   Compulsions:  N/A   Inattention:  N/A   Hyperactivity/Impulsivity:  N/A   Oppositional/Defiant Behaviors:  Easily annoyed; Argumentative Spencer Jackson reported most mood swings are triggered by work or disagreements with his wife.)   Emotional Irregularity:  N/A   Other Mood/Personality Symptoms:  No data recorded   Risk Assessment- Self-Harm Potential: Risk Assessment For Self-Harm Potential Thoughts  of Self-Harm: No current thoughts Method: No plan Availability of Means: No access/NA Additional Comments for Self-Harm Potential: Spencer Jackson denied any hx of SI, attempts, or self-harm, but is agreeable to voluntary hospitalization should thoughts arise with intent and/or plan .    Risk Assessment -Dangerous to Others Potential: Risk  Assessment For Dangerous to Others Potential Method: No Plan Availability of Means: No access or NA Intent:  NA Notification Required: No need or identified person  Mental Status Exam Appearance and self-care  Stature:  Average (5'11, self-reported.)   Weight:  Obese (225lbs, self-reported.)   Clothing:  Casual   Grooming:  Normal   Cosmetic use:  None   Posture/gait:  Normal   Motor activity:  Not Remarkable   Sensorium  Attention:  Normal   Concentration:  Normal   Orientation:  X5   Recall/memory:  Normal   Affect and Mood  Affect:  Anxious   Mood:  Anxious   Relating  Eye contact:  Normal   Facial expression:  Anxious   Attitude toward examiner:  Cooperative   Thought and Language  Speech flow: Normal   Thought content:  Appropriate to Mood and Circumstances   Preoccupation:  None   Hallucinations:  None   Organization:  No data recorded  [employer] of Knowledge:  Average   Intelligence:  Average   Abstraction:  Normal   Judgement:  Normal   Reality Testing:  Adequate   Insight:  Fair   Decision Making:  Impulsive   Social Functioning  Social Maturity:  Responsible (I'm not really that much of a social person.")   Social Judgement:  Normal   Stress  Stressors:  Family conflict; Work Spencer Jackson reported that he and his wife argue a lot, and he finds work to be stressful too.)   Coping Ability:  Advice worker Deficits:  Theatre stage manager; Self-care; Interpersonal   Supports:  Family; Support needed     Religion: Religion/Spirituality Are You A Religious Person?: No  Leisure/Recreation: Leisure / Recreation Do You Have Hobbies?: Yes Leisure and Hobbies: Spencer Jackson reported that he recently started rock climbing again  Exercise/Diet: Exercise/Diet Do You Exercise?: Yes What Type of Exercise Do You Do?: Run/Walk,Weight Training How Many Times a Week Do You Exercise?: 4-5 times a week Have You Gained  or Lost A Significant Amount of Weight in the Past Six Months?: No Do You Follow a Special Diet?: No Do You Have Any Trouble Sleeping?: No   CCA Employment/Education Employment/Work Situation: Employment / Work Situation Employment situation: Employed 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 How long has patient been employed?: Since December 2016 Patient's job has been impacted by current illness: Yes Describe how patient's job has been impacted: "sometimes I have trouble concentrating" What is the longest time patient has a held a job?: Over 6 years Where was the patient employed at that time?: [employer] Has patient ever been in the [employer]?: No  Education: Education Is Patient Currently Attending School?: No Last Grade Completed: 12 Name of [place]: [place] Did Teacher, adult education From [employer]?: Yes Did You Attend College?: Yes What Type of College Degree Do you Have?: Psychology in 2007, Mudlogger in 2014 Did [place]?: Yes What is Your Teacher, English as a foreign language Degree?: No degree Did You Have An Individualized Education Program (IIEP): No Did You Have Any Difficulty At School?: No   CCA Family/Childhood History Family and Relationship History: Family history Marital status: Married Number  of Years Married: 15 What types of issues is patient dealing with in the relationship?: Torry reported that they frequently argue. Are you sexually active?: Yes What is your sexual orientation?: Heterosexual Has your sexual activity been affected by drugs, alcohol, medication, or emotional stress?: Unique reported that his use of alcohol has caused some friction in the relationship. Does patient have children?: Yes How many children?: 3 How is patient's relationship with their children?: "I think things are good with [place]".  Childhood History:  Childhood History By whom was/is the patient raised?: Both parents Additional childhood history  information: Marquarius reported that he grew up in [place], [place] Description of patient's relationship with caregiver when they were a child: Anais reported that he was closer to his mother, as his father worked often. Patient's description of current relationship with people who raised him/her: Sylar reported that nothing has changed, he still doesn't talk or get along with father much. How were you disciplined when you got in trouble as a child/adolescent?: Moses reported that he wasn't hit, but would be threatened with a belt. Does patient have siblings?: Yes Number of Siblings: 2 Description of patient's current relationship with siblings: "I still chat with my brother frequently.  Me and my sister don't talk much". Did patient suffer any verbal/emotional/physical/sexual abuse as a child?: No Did patient suffer from severe childhood neglect?: No Has patient ever been sexually abused/assaulted/raped as an adolescent or adult?: No Was the patient ever a victim of a crime or a disaster?: No Witnessed domestic violence?: No Has patient been affected by domestic violence as an adult?: No   CCA Substance Use Alcohol/Drug Use: Alcohol / Drug Use Pain Medications: Denied. Prescriptions: Blood pressure medication Over the Counter: Denied. History of alcohol / drug use?: Yes (Currently drinks beer or wine 4-5 times per week.  Typically 4 beers or glasses of wine.  Admits "I used to be a really heavy drinker about 5 years ago.  It was affecting my relationship with my wife so I cut down a lot") Longest period of sobriety (when/how long): Jhett reported that he has been drinking regularly since he was age 12 and has been trying to quit, with a success period of roughly 4-5 months previously. Negative Consequences of Use: Legal,Personal relationships (DWI at age 74, used to drink more before becoming a parent, led to marital issues.) Withdrawal Symptoms:  Spencer Jackson denied any w/d symptoms experienced from  abstaining from use, but acknowledges that it has been some time since he was sober for extended period.) Substance #1 Name of Substance 1: Alcohol/ETOH 1 - Age of First Use: 17 1 - Amount (size/oz): 3 high gravity 6-7% 12oz beers 1 - Frequency: 3-4x per week 1 - Duration: 5 years 1 - Last Use / Amount: Sunday night, 3 beers 1 - Method of Aquiring: Grocery store. 1- Route of Use: Oral.  ASAM's:  Six Dimensions of Multidimensional Assessment  Dimension 1:  Acute Intoxication and/or Withdrawal Potential:   Dimension 1:  Description of individual's past and current experiences of substance use and withdrawal: Spencer Jackson reported that he has been drinking since age 34, with current pattern of drinking x3-4 times per week, 3 high gravity 6-7% 12oz beers on average. He denied experiencing any withdrawal symptoms when abstaining.  Dimension 2:  Biomedical Conditions and Complications:   Dimension 2:  Description of patient's biomedical conditions and  complications: Spencer Jackson denied any impact alcohol has had on physical health, but acknowledges that he worries often about how  alcohol might affect his health in the long term.  Dimension 3:  Emotional, Behavioral, or Cognitive Conditions and Complications:  Dimension 3:  Description of emotional, behavioral, or cognitive conditions and complications: Spencer Jackson reported that he believes alcohol could be contributing to mood swings, and affecting relationship at home due to his behavior.  Dimension 4:  Readiness to Change:  Dimension 4:  Description of Readiness to Change criteria: Spencer Jackson reported that he feels in this moment 100% committed to quitting, but admitted his motivation fluctuates day to day, stating "Tonight it will probably be 0%".  Dimension 5:  Relapse, Continued use, or Continued Problem Potential:  Dimension 5:  Relapse, continued use, or continued problem potential critiera description: Spencer Jackson reported that it is very likely he will continue to drink  unless he engages in regular therapy to gain more insight into overall impact this substance is having on daily life.  Spencer Jackson reported that he is motivated to begin outpatient therapy biweekly.  Dimension 6:  Recovery/Living Environment:  Dimension 6:  Recovery/Iiving environment criteria description: Spencer Jackson reported that he lives at home with his wife and 3 children.  He reported that they used to argue a lot about his drinking but "She seems to have given up more".  Spencer Jackson reported that his job is stressful and he lacks support in the area due to moving here a few years ago and not having much support outside family. He reported that he successfully attended court mandated treatment for DWI in his 20's, and was labeled a 'weekend warrior', but has not had any legal issues since then.  ASAM Severity Score: ASAM's Severity Rating Score: 7  ASAM Recommended Level of Treatment: ASAM Recommended Level of Treatment: Level I Outpatient Treatment Goldring reported that he wishes to only work one on one with clinician at outpatient level to assist with management of his drinking, although he could be appropriate in time for SAIOP if condition worsens.)   Substance use Disorder (SUD) Substance Use Disorder (SUD)  Checklist Symptoms of Substance Use: Continued use despite having a persistent/recurrent physical/psychological problem caused/exacerbated by use,Persistent desire or unsuccessful efforts to cut down or control use,Continued use despite persistent or recurrent social, interpersonal problems, caused or exacerbated by use Aaron Edelman reported the alcohol use at present has very mild symptoms overall, but could be minimizing based upon presence of mood swings and past legal history.)  Recommendations for Services/Supports/Treatments: Recommendations for Services/Supports/Treatments Recommendations For Services/Supports/Treatments: Individual Therapy  DSM5 Diagnoses: Patient Active Problem List   Diagnosis Date  Noted   RUQ discomfort 10/14/2020   Well adult exam 08/16/2020   PVC's (premature ventricular contractions)    Alcohol use disorder, mild, abuse 05/16/2018   Hypertension 05/16/2018   Snores 06/04/2017   Spondylolisthesis of lumbar region 05/30/2016   PAC (premature atrial contraction)     Patient Centered Plan: Meet with clinician x1 every 2 weeks for individual in-person therapy sessions to assist with making progress towards goals and address any barriers to success; Consider scheduling an initial appointment with a new psychiatrist within next 90 days to determine appropriateness for medication and address any symptoms that therapy alone has proven ineffective for addressing; Reduce depression from average severity level of 6/10 down to a 4/10 over next 90 days by engaging in 4-5 hours per week of positive self-care activities; Reduce average anxiety severity from 6/10 down to a 4/10 within next 90 days by practicing 3-4 relaxation techniques daily such as mindful breathing, guided imagery, progressive muscle relaxation, and/or  meditation; Utilize 3-4 anger management skills per week in order to reduce average mood swings from 2-3x per month down to 0 in next 90 days and improve relationship with family; Commit to working at job 40 hours per week in order to [employer] and productivity, while assessing for ways to improve work/life balance in order to reduce negative impact on family life and mental health; Consider engaging in marriage counseling with wife to improve communication and balance within relationship  if individual therapy proves ineffective for resolving ongoing issues over next 6 months; Monitor weekly alcohol use and overall impact upon home/social/work domains and mental/physical health closely over course of next 3 months to determine whether admission to a substance use treatment program is warranted to prevent pattern from worsening; Identify 3  internal/external triggers within next 90 days that increase impulse to drink alcohol, in addition to at least 3 healthy coping skills or self-care activities that can be used to counter these effectively.       Referrals to Alternative Service(s): Referred to Alternative Service(s):   Place:   Date:   Time:    Referred to Alternative Service(s):   Place:   Date:   Time:    Referred to Alternative Service(s):   Place:   Date:   Time:    Referred to Alternative Service(s):   Place:   Date:   Time:     [place]anville Lewis, Deon Pilling 10/27/20

## 2020-11-10 ENCOUNTER — Ambulatory Visit (HOSPITAL_COMMUNITY): Payer: 59 | Admitting: Licensed Clinical Social Worker

## 2020-11-24 ENCOUNTER — Ambulatory Visit (HOSPITAL_COMMUNITY): Payer: 59 | Admitting: Licensed Clinical Social Worker

## 2021-01-07 ENCOUNTER — Ambulatory Visit (INDEPENDENT_AMBULATORY_CARE_PROVIDER_SITE_OTHER): Payer: 59 | Admitting: Family Medicine

## 2021-01-07 ENCOUNTER — Ambulatory Visit (INDEPENDENT_AMBULATORY_CARE_PROVIDER_SITE_OTHER): Payer: 59

## 2021-01-07 ENCOUNTER — Encounter: Payer: Self-pay | Admitting: Family Medicine

## 2021-01-07 ENCOUNTER — Other Ambulatory Visit: Payer: Self-pay

## 2021-01-07 VITALS — BP 137/92 | HR 48 | Temp 97.7°F | Ht 71.0 in | Wt 228.8 lb

## 2021-01-07 DIAGNOSIS — M4316 Spondylolisthesis, lumbar region: Secondary | ICD-10-CM

## 2021-01-07 DIAGNOSIS — M7918 Myalgia, other site: Secondary | ICD-10-CM

## 2021-01-07 DIAGNOSIS — G8929 Other chronic pain: Secondary | ICD-10-CM

## 2021-01-07 DIAGNOSIS — M5136 Other intervertebral disc degeneration, lumbar region: Secondary | ICD-10-CM | POA: Diagnosis not present

## 2021-01-07 DIAGNOSIS — M545 Low back pain, unspecified: Secondary | ICD-10-CM

## 2021-01-07 MED ORDER — MELOXICAM 15 MG PO TABS: ORAL_TABLET | ORAL | 0 refills | Status: DC

## 2021-01-07 NOTE — Patient Instructions (Signed)
Have a xray completed.  Try meloxicam daily for 2 weeks then as needed.  You will be contacted from physical therapy.

## 2021-01-09 NOTE — Assessment & Plan Note (Signed)
History of spondylolisthesis of lumbar region.  Updated lumbar films ordered.  Referral placed to PT and will add meloxicam.  If not improving with this we can consider having him see Dr. Dianah Field and/or get MRI.

## 2021-01-09 NOTE — Progress Notes (Signed)
Spencer Jackson - 40 y.o. male MRN [id number]  Date of birth: [birth year]  Subjective Chief Complaint  Patient presents with  . Muscle Pain    HPI Spencer Jackson is a 40 y.o. male here today with complaint of buttock pain.  Pain is located around midline, sacral/coccyx area.  Doesn't recall any injury or overuse to this area.  Pain does not really radiate.  Pain is worse with sitting and better with standing.  He denies changes to bowels or urinary symptoms.  He hasn't really tried anything to help with symptoms.    ROS:  A comprehensive ROS was completed and negative except as noted per HPI  No Known Allergies  Past Medical History:  Diagnosis Date  . Allergy   . Dizziness   . Hypertension    no medications at this time 09-25-17  . PAC (premature atrial contraction)   . PVC's (premature ventricular contractions)     Past Surgical History:  Procedure Laterality Date  . back cyst resection      Social History   Socioeconomic History  . Marital status: Married    Spouse name: Not on file  . Number of children: Not on file  . Years of education: Not on file  . Highest education level: Not on file  Occupational History  . Not on file  Tobacco Use  . Smoking status: Former Smoker    Quit date: 11/25/2005    Years since quitting: 15.1  . Smokeless tobacco: Never Used  Vaping Use  . Vaping Use: Never used  Substance and Sexual Activity  . Alcohol use: Yes    Alcohol/week: 9.0 standard drinks    Types: 9 Cans of beer per week  . Drug use: No  . Sexual activity: Not on file  Other Topics Concern  . Not on file  Social History Narrative  . Not on file   Social Determinants of Health   Financial Resource Strain: Not on file  Food Insecurity: Not on file  Transportation Needs: Not on file  Physical Activity: Not on file  Stress: Not on file  Social Connections: Not on file    Family History  Problem Relation Age of Onset  . Hepatitis Mother        autoimmune  .  Colon cancer Neg Hx   . Esophageal cancer Neg Hx   . Rectal cancer Neg Hx   . Stomach cancer Neg Hx     Health Maintenance  Topic Date Due  . Hepatitis C Screening  Never done  . COVID-19 Vaccine (3 - Booster for Pfizer series) 06/22/2020  . INFLUENZA VACCINE  04/11/2021  . COLONOSCOPY (Pts 45-61yrs Insurance coverage will need to be confirmed)  09/25/2022  . TETANUS/TDAP  05/30/2026  . HIV Screening  Completed  . HPV VACCINES  Aged Out     ----------------------------------------------------------------------------------------------------------------------------------------------------------------------------------------------------------------- Physical Exam BP (!) 137/92 (BP Location: Left Arm, Patient Position: Sitting, Cuff Size: Large)   Pulse (!) 48   Temp 97.7 F (36.5 C)   Ht 5\' 11"  (1.803 m)   Wt 228 lb 12.8 oz (103.8 kg)   SpO2 100%   BMI 31.91 kg/m   Physical Exam Constitutional:      Appearance: Normal appearance.  Musculoskeletal:     Cervical back: Neck supple.     Comments: TTP along sacrum and lower lumbar paraspinals Normal ROM.  Normal LE strength and sensation.    Neurological:     General: No focal deficit present.  Mental Status: He is alert.     ------------------------------------------------------------------------------------------------------------------------------------------------------------------------------------------------------------------- Assessment and Plan  Spondylolisthesis of lumbar region History of spondylolisthesis of lumbar region.  Updated lumbar films ordered.  Referral placed to PT and will add meloxicam.  If not improving with this we can consider having him see Dr. Dianah Field and/or get MRI.     Meds ordered this encounter  Medications  . meloxicam (MOBIC) 15 MG tablet    Sig: Take daily x2 weeks then as needed.    Dispense:  30 tablet    Refill:  0    No follow-ups on file.    This visit occurred  during the SARS-CoV-2 public health emergency.  Safety protocols were in place, including screening questions prior to the visit, additional usage of staff PPE, and extensive cleaning of exam room while observing appropriate contact time as indicated for disinfecting solutions.

## 2021-01-26 ENCOUNTER — Ambulatory Visit (INDEPENDENT_AMBULATORY_CARE_PROVIDER_SITE_OTHER): Payer: 59 | Admitting: Rehabilitative and Restorative Service Providers"

## 2021-01-26 ENCOUNTER — Other Ambulatory Visit: Payer: Self-pay

## 2021-01-26 ENCOUNTER — Encounter: Payer: Self-pay | Admitting: Rehabilitative and Restorative Service Providers"

## 2021-01-26 DIAGNOSIS — M545 Low back pain, unspecified: Secondary | ICD-10-CM

## 2021-01-26 DIAGNOSIS — M533 Sacrococcygeal disorders, not elsewhere classified: Secondary | ICD-10-CM

## 2021-01-26 DIAGNOSIS — R29898 Other symptoms and signs involving the musculoskeletal system: Secondary | ICD-10-CM | POA: Diagnosis not present

## 2021-01-26 NOTE — Patient Instructions (Addendum)
  Access Code: [email or web address]: [email or web address]: 05/18/2022Prepared by: [place] Silveria HoltExercises  Supine Piriformis Stretch with Leg Straight - 2 x daily - 7 x weekly - 1 sets - 3 reps - 30 sec hold  Thomas Stretch on Table - 2 x daily - 7 x weekly - 1 sets - 3 reps - 30 sec hold  Supine Transversus Abdominis Bracing with Pelvic Floor Contraction - 2 x daily - 7 x weekly - 1 sets - 10 reps - 10sec hold  Sit to Stand - 2 x daily - 7 x weekly - 1 sets - 10 reps - 3-5 sec hold Patient Education  Biomedical scientist

## 2021-01-26 NOTE — Therapy (Signed)
[place], [place], [address] Phone: [phone]   Fax:  [phone]  Physical Therapy Evaluation  Patient Details  Name: Spencer Jackson MRN: [id number] Date of Birth: [birth year] Referring Provider (PT): Dr Luetta Nutting   Encounter Date: 01/26/2021   PT End of Session - 01/26/21 0904    Visit Number 1    Number of Visits 12    Date for PT Re-Evaluation 03/09/21    PT Start Time 0802    PT Stop Time 3419    PT Time Calculation (min) 52 min    Activity Tolerance Patient tolerated treatment well           Past Medical History:  Diagnosis Date  . Allergy   . Dizziness   . Hypertension    no medications at this time 09-25-17  . PAC (premature atrial contraction)   . PVC's (premature ventricular contractions)     Past Surgical History:  Procedure Laterality Date  . back cyst resection      There were no vitals filed for this visit.    Subjective Assessment - 01/26/21 0808    Subjective Patient reports he is having pain in the tail bone when sitting for the past two months with no known injury. Symptoms increase with prolonged sitting    Pertinent History History of LBP for 15 yrs on an intermittent basis; spondylolisthesis    Patient Stated Goals get rid of the pain    Currently in Pain? Yes    Pain Score 1     Pain Location Sacrum    Pain Orientation Mid    Pain Descriptors / Indicators Dull;Aching    Pain Type Acute pain    Pain Onset More than a month ago    Pain Frequency Intermittent    Aggravating Factors  sitting    Pain Relieving Factors standing; moving; getting out of sitting              [facility] PT Assessment - 01/26/21 0001      Assessment   Referring Provider (PT) Dr Luetta Nutting    Onset Date/Surgical Date 11/09/20    Hand Dominance Right    Next MD Visit PRN    Prior Therapy here for shoulder      Precautions   Precautions None      Restrictions   Weight  Bearing Restrictions No      Balance Screen   Has the patient fallen in the past 6 months No      Prior Function   Level of Independence Independent    Vocation Full time employment    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    Leisure computer; yard work      Observation/Other Assessments   Focus on Therapeutic Outcomes (FOTO)  49      Sensation   Additional Comments WNL's per pt report      AROM   Lumbar Flexion 85%    Lumbar Extension 75%    Lumbar - Right Side Bend 75% pulling Lt sacral area    Lumbar - Left Side Bend 75% pulling Rt sacral area    Lumbar - Right Rotation 50% pulling anterior hip    Lumbar - Left Rotation 50% pulling anterior hip      Strength   Overall Strength Comments WFL's      Flexibility   Hamstrings tight end ranges bilat    Quadriceps tight Rt >  Lt    ITB WFL's bilat    Piriformis tight Lt > Rt      Palpation   Spinal mobility hypomobile lower lumbar - pai nwith PA mobs L3/4/5 to sacrum    SI assessment  higher Lt than Rt when prone; pain with palpation sacrum to coccyx - increased tightness Rt lateral coccyx area    Palpation comment tight iliopsoas Lt > Rt; tight piriformis Lt > Rt; tender lateral sacrum to coccyx      Special Tests   Other special tests tight bilat Thomas test                      Objective measurements completed on examination: See above findings.       [facility] Adult PT Treatment/Exercise - 01/26/21 0001      Self-Care   Self-Care Other Self-Care Comments    Other Self-Care Comments  instruction in modification for sitting; proper body mechanics      Knee/Hip Exercises: Stretches   Hip Flexor Stretch Right;Left;2 reps;30 seconds   seated and Thomas supine   Piriformis Stretch Right;Left;2 reps;30 seconds   supine travell     Knee/Hip Exercises: Seated   Sit to [employer] 10 reps;without UE support   hinging from hips     Knee/Hip Exercises: Supine   Other Supine Knee/Hip  Exercises 3 part core 10 sec x 10 reps                  PT Education - 01/26/21 0846    Education Details HEP POC    Person(s) Educated Patient    Methods Explanation;Demonstration;Tactile cues;Verbal cues;Handout    Comprehension Verbalized understanding;Returned demonstration;Verbal cues required;Tactile cues required               PT Long Term Goals - 01/26/21 0910      PT LONG TERM GOAL #1   Title Decrease pain in sacrum and coccyx areas with sitting allowing patient to sit for 2-4 hours with minimal to no pain </= to 0/10 to 2/10    Time 6    Period Weeks    Status New    Target Date 03/09/21      PT LONG TERM GOAL #2   Title Patient to demonstrate and/or verbalize proper body mechanics    Time 6    Period Weeks    Status New    Target Date 03/09/21      PT LONG TERM GOAL #3   Title Independent in HEP    Time 6    Period Weeks    Status New    Target Date 03/09/21      PT LONG TERM GOAL #4   Title Improve Functional Limitations score to 83    Time 6    Period Weeks    Status New    Target Date 03/09/21      PT LONG TERM GOAL #5   Title -                  Plan - 01/26/21 0904    Clinical Impression Statement Patient presents with ~ 2 month history of sacral/coccyx pain with no known injury. He has a history of LBP on an intermittent basis for the past 15 years but this current pain is not the same type pain. Patient has limited trunk and LE mobility; muscular tightness in iliopsoas and piriformis; pain with palpation lumbar spine to sacrum and coccyx; pain with sitting.  Stability/Clinical Decision Making Stable/Uncomplicated    Clinical Decision Making Low    Rehab Potential Good    PT Frequency 2x / week    PT Duration 6 weeks    PT Treatment/Interventions ADLs/Self Care Home Management;Aquatic Therapy;Cryotherapy;Electrical Stimulation;Iontophoresis 4mg /ml Dexamethasone;Moist Heat;Ultrasound;Functional mobility training;Therapeutic  activities;Therapeutic exercise;Balance training;Neuromuscular re-education;Patient/family education;Manual techniques;Dry needling;Taping    PT Next Visit Plan review HEP; continue assessment of sacral and coccyx dysfunction; trial of DN vs deep tissue work through the lateral border of the sacrum into the coccyx; may try myofacial relese to same area; modalities as indicated    PT Home Exercise Plan [email or web address]    Consulted and Agree with Plan of Care Patient           Patient will benefit from skilled therapeutic intervention in order to improve the following deficits and impairments:  Decreased range of motion,Decreased activity tolerance,Pain,Hypomobility,Impaired flexibility,Improper body mechanics,Decreased mobility,Decreased strength,Postural dysfunction  Visit Diagnosis: Acute bilateral low back pain without sciatica  Coccyx pain  Other symptoms and signs involving the musculoskeletal system     Problem List Patient Active Problem List   Diagnosis Date Noted  . RUQ discomfort 10/14/2020  . Well adult exam 08/16/2020  . PVC's (premature ventricular contractions)   . Alcohol use disorder, mild, abuse 05/16/2018  . Hypertension 05/16/2018  . Snores 06/04/2017  . 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 PAC (premature atrial contraction)     Jennavie Martinek Nilda Simmer PT, MPH  01/26/2021, 9:14 AM  [facility] [place], [place], [address] Phone: [phone]   Fax:  [phone]  Name: Spencer Jackson MRN: [id number] Date of Birth: May 10, 1981

## 2021-02-02 ENCOUNTER — Other Ambulatory Visit: Payer: Self-pay

## 2021-02-02 ENCOUNTER — Ambulatory Visit (INDEPENDENT_AMBULATORY_CARE_PROVIDER_SITE_OTHER): Payer: 59 | Admitting: Rehabilitative and Restorative Service Providers"

## 2021-02-02 ENCOUNTER — Encounter: Payer: Self-pay | Admitting: Rehabilitative and Restorative Service Providers"

## 2021-02-02 DIAGNOSIS — M545 Low back pain, unspecified: Secondary | ICD-10-CM | POA: Diagnosis not present

## 2021-02-02 DIAGNOSIS — M533 Sacrococcygeal disorders, not elsewhere classified: Secondary | ICD-10-CM

## 2021-02-02 DIAGNOSIS — R29898 Other symptoms and signs involving the musculoskeletal system: Secondary | ICD-10-CM | POA: Diagnosis not present

## 2021-02-02 NOTE — Therapy (Addendum)
[place], [place], [address] Phone: [phone]   Fax:  [phone]  Physical Therapy Treatment  Patient Details  Name: Spencer Jackson MRN: [id number] Date of Birth: 04/04/1981 Referring Provider (PT): Dr Luetta Nutting   Encounter Date: 02/02/2021   PT End of Session - 02/02/21 0801     Visit Number 2    Number of Visits 12    Date for PT Re-Evaluation 03/09/21    PT Start Time 0800    PT Stop Time 0848    PT Time Calculation (min) 48 min    Activity Tolerance Patient tolerated treatment well             Past Medical History:  Diagnosis Date   Allergy    Dizziness    Hypertension    no medications at this time 09-25-17   PAC (premature atrial contraction)    PVC's (premature ventricular contractions)     Past Surgical History:  Procedure Laterality Date   back cyst resection      There were no vitals filed for this visit.   Subjective Assessment - 02/02/21 0801     Subjective Patient reports he is having pain in the tail bone when sitting for the past two months with no known injury. Symptoms increase with prolonged sitting. He has a donut pillow which he puts behind his back but that doesn't help much. Has not done exercises much - twice since last week.    Currently in Pain? Yes    Pain Score 3     Pain Location Sacrum    Pain Orientation Mid    Pain Descriptors / Indicators Dull;Aching                [facility] PT Assessment - 02/02/21 0001       Assessment   Medical Diagnosis LBP/sacral  and coccyx pain    Referring Provider (PT) Dr Luetta Nutting    Onset Date/Surgical Date 11/09/20    Hand Dominance Right    Next MD Visit PRN    Prior Therapy here for shoulder      Palpation   Palpation comment tight iliopsoas Lt > Rt; tight piriformis Lt > Rt; tender lateral sacrum to coccyx                           [facility] Adult PT Treatment/Exercise - 02/02/21  0001       Therapeutic Activites    Therapeutic Activities Other Therapeutic Activities    Other Therapeutic Activities myofacial release standing ~4 in plastic ball; sitting on softer 4 in ball ~ 2 min      Knee/Hip Exercises: Stretches   Hip Flexor Stretch Right;Left;3 reps;30 seconds   seated and Thomas supine   Piriformis Stretch Right;Left;3 reps;30 seconds   supine travell     Knee/Hip Exercises: Aerobic   Tread Mill 5 min x 2.0 to 2.5 mph      Knee/Hip Exercises: Seated   Sit to [employer] 20 reps;without UE support   hinging from hips     Manual Therapy   Manual therapy comments pt prone    Joint Mobilization PA mobs sacrum    Soft tissue mobilization deep tissue work and IASTM bilat posterior hips to coccyx to ischial tuberosities    Myofascial Release posterior hips  PT Long Term Goals - 01/26/21 0910       PT LONG TERM GOAL #1   Title Decrease pain in sacrum and coccyx areas with sitting allowing patient to sit for 2-4 hours with minimal to no pain </= to 0/10 to 2/10    Time 6    Period Weeks    Status New    Target Date 03/09/21      PT LONG TERM GOAL #2   Title Patient to demonstrate and/or verbalize proper body mechanics    Time 6    Period Weeks    Status New    Target Date 03/09/21      PT LONG TERM GOAL #3   Title Independent in HEP    Time 6    Period Weeks    Status New    Target Date 03/09/21      PT LONG TERM GOAL #4   Title Improve Functional Limitations score to 83    Time 6    Period Weeks    Status New    Target Date 03/09/21      PT LONG TERM GOAL #5   Title -                   Plan - 02/02/21 0813     Clinical Impression Statement Patient reports minimal change in symptoms. He has tried to modify sitting position but continues to have pain with prolonged sitting. He has not been consistent with exercises, reporting that he has done exercises only twice since PT visit. Paitent has  continued pain in the lumbar spine to sacrum and coccyx increased with prolonged sitting.    Rehab Potential Good    PT Frequency 2x / week    PT Duration 6 weeks    PT Treatment/Interventions ADLs/Self Care Home Management;Aquatic Therapy;Cryotherapy;Electrical Stimulation;Iontophoresis 8m/ml Dexamethasone;Moist Heat;Ultrasound;Functional mobility training;Therapeutic activities;Therapeutic exercise;Balance training;Neuromuscular re-education;Patient/family education;Manual techniques;Dry needling;Taping    PT Next Visit Plan review HEP; continue assessment of sacral and coccyx dysfunction; consider trial of DN, assess response to deep tissue work through the lateral border of the sacrum into the coccyx; may try myofacial relese to same area; modalities as indicated  - trial quadruped    PT Home Exercise Plan [email or web address]    Consulted and Agree with Plan of Care Patient             Patient will benefit from skilled therapeutic intervention in order to improve the following deficits and impairments:     Visit Diagnosis: Acute bilateral low back pain without sciatica  Coccyx pain  Other symptoms and signs involving the musculoskeletal system     Problem List Patient Active Problem List   Diagnosis Date Noted   RUQ discomfort 10/14/2020   Well adult exam 08/16/2020   PVC's (premature ventricular contractions)    Alcohol use disorder, mild, abuse 05/16/2018   Hypertension 05/16/2018   Snores 06/04/2017   Spondylolisthesis of lumbar region 05/30/2016   PAC (premature atrial contraction)     Avishai Reihl PNilda SimmerPT, MPH  02/02/2021, 8:48 AM  [facility] [address] [address]Phone: [phone]  Fax:  [phone] Name: BJarrid LienhardMRN: [id number] of Birth: 1Apr 20, 1982 PHYSICAL THERAPY DISCHARGE SUMMARY  Visits from Start of Care: 2  Current functional level related to goals / functional  outcomes: See progress note for discharge status   Remaining deficits: Unknown    Education / Equipment: Initial  HEP    Patient agrees to discharge. Patient goals were not met. Patient is being discharged due to not returning since the last visit.  Niema Carrara P. Helene Kelp PT, MPH 03/08/21 12:37 PM

## 2021-02-09 ENCOUNTER — Encounter: Payer: 59 | Admitting: Rehabilitative and Restorative Service Providers"

## 2021-06-16 ENCOUNTER — Other Ambulatory Visit: Payer: Self-pay

## 2021-06-16 ENCOUNTER — Encounter: Payer: 59 | Admitting: Family Medicine

## 2021-06-16 ENCOUNTER — Encounter: Payer: Self-pay | Admitting: Family Medicine

## 2021-06-16 ENCOUNTER — Ambulatory Visit (INDEPENDENT_AMBULATORY_CARE_PROVIDER_SITE_OTHER): Payer: 59 | Admitting: Family Medicine

## 2021-06-16 VITALS — BP 127/82 | HR 44 | Temp 97.4°F | Wt 223.1 lb

## 2021-06-16 DIAGNOSIS — Z0000 Encounter for general adult medical examination without abnormal findings: Secondary | ICD-10-CM | POA: Diagnosis not present

## 2021-06-16 NOTE — Progress Notes (Signed)
BP 127/82 (BP Location: Left Arm, Patient Position: Sitting, Cuff Size: Large)   Pulse (!) 44   Temp (!) 97.4 F (36.3 C) (Oral)   Wt 223 lb 1.9 oz (101.2 kg)   SpO2 100%   BMI 31.12 kg/m    Subjective:    Patient ID: Spencer Jackson, male    DOB: 1980-11-20, 40 y.o.   MRN: [id number]  HPI: Densel Kronick is a 40 y.o. male presenting on 06/16/2021 for comprehensive medical examination. Current medical complaints include:none  He currently lives with: wife and 3 kids (44, 58, 6) Interim Problems from his last visit: no  He reports regular vision exams q1-5y: yes He reports regular dental exams q 39m: yes His diet consists of: regular He endorses exercise and/or activity of: 5 days a week on average He works at: software   He endorses ETOH use - 10 servings per week He denies nictoine use  He denies illegal substance use   He is currently sexually active with wife He denies concerns today about STI  He denies concerns about skin changes today:  He denies concerns about bowel changes today:  He denies concerns about bladder changes today:   Depression Screen done today and results listed below:  Depression screen [facility] 2/9 06/16/2021 07/02/2019 05/16/2018 06/04/2017 06/04/2017  Decreased Interest 0 1 1 0 0  Down, Depressed, Hopeless 0 1 1 0 0  PHQ - 2 Score 0 2 2 0 0  Altered sleeping - 0 0 2 -  Tired, decreased energy - 3 3 2  -  Change in appetite - 0 1 1 -  Feeling bad or failure about yourself  - 0 1 0 -  Trouble concentrating - 1 2 0 -  Moving slowly or fidgety/restless - 0 0 0 -  Suicidal thoughts - 0 0 0 -  PHQ-9 Score - 6 9 5  -  Difficult doing work/chores - Somewhat difficult Somewhat difficult - -  Some encounter information is confidential and restricted. Go to Review Flowsheets activity to see all data.    The patient does not have a history of falls.    Past Medical History:  Past Medical History:  Diagnosis Date   Allergy    Dizziness    Hypertension    no  medications at this time 09-25-17   PAC (premature atrial contraction)    PVC's (premature ventricular contractions)     Surgical History:  Past Surgical History:  Procedure Laterality Date   back cyst resection      Medications:  Current Outpatient Medications on File Prior to Visit  Medication Sig   lisinopril (ZESTRIL) 10 MG tablet Take 1 tablet (10 mg total) by mouth daily.   meloxicam (MOBIC) 15 MG tablet Take daily x2 weeks then as needed. (Patient not taking: Reported on 06/16/2021)   No current facility-administered medications on file prior to visit.    Allergies:  No Known Allergies  Social History:  Social History   Socioeconomic History   Marital status: Married    Spouse name: Not on file   Number of children: Not on file   Years of education: Not on file   Highest education level: Not on file  Occupational History   Not on file  Tobacco Use   Smoking status: Former    Types: Cigarettes    Quit date: 11/25/2005    Years since quitting: 15.5   Smokeless tobacco: Never  Vaping Use   Vaping Use: Never used  Substance  and Sexual Activity   Alcohol use: Yes    Alcohol/week: 9.0 standard drinks    Types: 9 Cans of beer per week   Drug use: No   Sexual activity: Not on file  Other Topics Concern   Not on file  Social History Narrative   Not on file   Social Determinants of Health   Financial Resource Strain: Not on file  Food Insecurity: Not on file  Transportation Needs: Not on file  Physical Activity: Not on file  Stress: Not on file  Social Connections: Not on file  Intimate Partner Violence: Not on file   Social History   Tobacco Use  Smoking Status Former   Types: Cigarettes   Quit date: 11/25/2005   Years since quitting: 15.5  Smokeless Tobacco Never   Social History   Substance and Sexual Activity  Alcohol Use Yes   Alcohol/week: 9.0 standard drinks   Types: 9 Cans of beer per week    Family History:  Family History  Problem  Relation Age of Onset   Hepatitis Mother        autoimmune   Colon cancer Neg Hx    Esophageal cancer Neg Hx    Rectal cancer Neg Hx    Stomach cancer Neg Hx     Past medical history, surgical history, medications, allergies, family history and social history reviewed with patient today and changes made to appropriate areas of the chart.   All ROS negative except what is listed above and in the HPI.      Objective:    BP 127/82 (BP Location: Left Arm, Patient Position: Sitting, Cuff Size: Large)   Pulse (!) 44   Temp (!) 97.4 F (36.3 C) (Oral)   Wt 223 lb 1.9 oz (101.2 kg)   SpO2 100%   BMI 31.12 kg/m   Wt Readings from Last 3 Encounters:  06/16/21 223 lb 1.9 oz (101.2 kg)  01/07/21 228 lb 12.8 oz (103.8 kg)  10/14/20 230 lb 1.6 oz (104.4 kg)    Physical Exam Vitals reviewed.  Constitutional:      Appearance: Normal appearance.  HENT:     Head: Normocephalic and atraumatic.     Right Ear: Tympanic membrane normal.     Left Ear: Tympanic membrane normal.     Nose: Nose normal.     Mouth/Throat:     Mouth: Mucous membranes are moist.     Pharynx: Oropharynx is clear.  Eyes:     Extraocular Movements: Extraocular movements intact.     Conjunctiva/sclera: Conjunctivae normal.     Pupils: Pupils are equal, round, and reactive to light.  Cardiovascular:     Rate and Rhythm: Normal rate and regular rhythm.     Pulses: Normal pulses.     Heart sounds: Normal heart sounds.  Pulmonary:     Effort: Pulmonary effort is normal.     Breath sounds: Normal breath sounds.  Abdominal:     General: Abdomen is flat. Bowel sounds are normal. There is no distension.     Palpations: Abdomen is soft. There is no mass.     Tenderness: There is no abdominal tenderness. There is no right CVA tenderness, left CVA tenderness, guarding or rebound.     Hernia: No hernia is present.  Genitourinary:    Comments: deferred Musculoskeletal:        General: Normal range of motion.      Cervical back: Normal range of motion and neck supple.  Right lower leg: No edema.     Left lower leg: No edema.  Lymphadenopathy:     Cervical: No cervical adenopathy.  Skin:    General: Skin is warm and dry.     Capillary Refill: Capillary refill takes less than 2 seconds.     Findings: No rash.  Neurological:     Mental Status: He is alert and oriented to person, place, and time.  Psychiatric:        Mood and Affect: Mood normal.        Behavior: Behavior normal.        Thought Content: Thought content normal.        Judgment: Judgment normal.    Results for orders placed or performed in visit on 08/16/20  COMPLETE METABOLIC PANEL WITH GFR  Result Value Ref Range   Glucose, Bld 94 65 - 99 mg/dL   BUN 17 7 - 25 mg/dL   Creat 0.96 0.60 - 1.35 mg/dL   GFR, Est Non African American 99 > OR = 60 mL/min/1.32m2   GFR, Est African American 115 > OR = 60 mL/min/1.33m2   BUN/Creatinine Ratio NOT APPLICABLE 6 - 22 (calc)   Sodium 138 135 - 146 mmol/L   Potassium 4.6 3.5 - 5.3 mmol/L   Chloride 103 98 - 110 mmol/L   CO2 29 20 - 32 mmol/L   Calcium 9.1 8.6 - 10.3 mg/dL   Total Protein 7.0 6.1 - 8.1 g/dL   Albumin 4.4 3.6 - 5.1 g/dL   Globulin 2.6 1.9 - 3.7 g/dL (calc)   AG Ratio 1.7 1.0 - 2.5 (calc)   Total Bilirubin 0.6 0.2 - 1.2 mg/dL   Alkaline phosphatase (APISO) 48 36 - 130 U/L   AST 23 10 - 40 U/L   ALT 18 9 - 46 U/L  CBC  Result Value Ref Range   WBC 5.4 3.8 - 10.8 Thousand/uL   RBC 5.04 4.20 - 5.80 Million/uL   Hemoglobin 15.4 13.2 - 17.1 g/dL   HCT 45.5 38.5 - 50.0 %   MCV 90.3 80.0 - 100.0 fL   MCH 30.6 27.0 - 33.0 pg   MCHC 33.8 32.0 - 36.0 g/dL   RDW 12.4 11.0 - 15.0 %   Platelets 234 140 - 400 Thousand/uL   MPV 9.7 7.5 - 12.5 fL  Lipid Profile  Result Value Ref Range   Cholesterol 164 <200 mg/dL   HDL 56 > OR = 40 mg/dL   Triglycerides 52 <150 mg/dL   LDL Cholesterol (Calc) 95 mg/dL (calc)   Total CHOL/HDL Ratio 2.9 <5.0 (calc)   Non-HDL Cholesterol  (Calc) 108 <130 mg/dL (calc)  TSH  Result Value Ref Range   TSH 1.76 0.40 - 4.50 mIU/L  Testosterone  Result Value Ref Range   Testosterone 486 250 - 827 ng/dL      Assessment & Plan:   Problem List Items Addressed This Visit   None Visit Diagnoses     Annual physical exam    -  Primary   Relevant Orders   CBC   Lipid panel   Comprehensive metabolic panel      Doing well overall. No complaints he wishes to discuss during physical. Routine labs ordered. States he does not need any refills at this time. Health promotion and safety discussed.   LABORATORY TESTING:  Health maintenance labs ordered today as discussed above.  - STI testing: deferred    IMMUNIZATIONS:   - Tdap: Tetanus vaccination status reviewed: last  tetanus booster within 10 years. - Influenza: Up to date - Pneumovax: Not applicable - Prevnar: Not applicable - HPV: Not applicable - Shingrix vaccine: Not applicable - [id number]: Up to date  SCREENING: - Colonoscopy: Not applicable  Discussed with patient purpose of the colonoscopy is to detect colon cancer at curable precancerous or early stages  - AAA Screening: Not applicable  -Hearing Test: Not applicable  -Spirometry: Not applicable  - Lung Cancer Screening: Not applicable    PATIENT COUNSELING:    Sexuality: Discussed sexually transmitted diseases, partner selection, use of condoms, avoidance of unintended pregnancy, and contraceptive alternatives.    I discussed with the patient that most people either abstain from alcohol or drink within safe limits (<=14/week and <=4 drinks/occasion for males, <=7/weeks and <= 3 drinks/occasion for females) and that the risk for alcohol disorders and other health effects rises proportionally with the number of drinks per week and how often a drinker exceeds daily limits.  Discussed cessation/primary prevention of drug use and availability of treatment for abuse.   Diet: Encouraged to adjust caloric intake to  maintain or achieve ideal body weight, to reduce intake of dietary saturated fat and total fat, to limit sodium intake by avoiding high sodium foods and not adding table salt, and to maintain adequate dietary potassium and calcium preferably from fresh fruits, vegetables, and low-fat dairy products.    Emphasized the importance of regular exercise  Injury prevention: Discussed safety belts, safety helmets, smoke detector, smoking near bedding or upholstery.   Dental health: Discussed importance of regular tooth brushing, flossing, and dental visits.   Follow up plan:  Return in about 1 year (around 06/16/2022).   He is aware that his last annual physical exam was 10 months ago.   Purcell Nails Olevia Bowens, DNP, FNP-C

## 2021-06-17 LAB — LIPID PANEL
Cholesterol: 162 mg/dL (ref <200)
HDL: 52 mg/dL (ref >=40)
LDL Cholesterol (Calc): 88 mg/dL (calc)
Non-HDL Cholesterol (Calc): 110 mg/dL (calc) (ref <130)
Total CHOL/HDL Ratio: 3.1 (calc) (ref <5.0)
Triglycerides: 121 mg/dL (ref <150)

## 2021-06-17 LAB — COMPREHENSIVE METABOLIC PANEL
AG Ratio: 1.9 (calc) (ref 1.0–2.5)
ALT: 25 U/L (ref 9–46)
AST: 23 U/L (ref 10–40)
Albumin: 4.7 g/dL (ref 3.6–5.1)
Alkaline phosphatase (APISO): 49 U/L (ref 36–130)
BUN: 15 mg/dL (ref 7–25)
CO2: 28 mmol/L (ref 20–32)
Calcium: 9.4 mg/dL (ref 8.6–10.3)
Chloride: 102 mmol/L (ref 98–110)
Creat: 0.91 mg/dL (ref 0.60–1.26)
Globulin: 2.5 g/dL (calc) (ref 1.9–3.7)
Glucose, Bld: 90 mg/dL (ref 65–99)
Potassium: 4.7 mmol/L (ref 3.5–5.3)
Sodium: 137 mmol/L (ref 135–146)
Total Bilirubin: 0.8 mg/dL (ref 0.2–1.2)
Total Protein: 7.2 g/dL (ref 6.1–8.1)

## 2021-06-17 LAB — CBC
HCT: 48.9 % (ref 38.5–50.0)
Hemoglobin: 16.3 g/dL (ref 13.2–17.1)
MCH: 30.3 pg (ref 27.0–33.0)
MCHC: 33.3 g/dL (ref 32.0–36.0)
MCV: 90.9 fL (ref 80.0–100.0)
MPV: 9.7 fL (ref 7.5–12.5)
Platelets: 256 10*3/uL (ref 140–400)
RBC: 5.38 10*6/uL (ref 4.20–5.80)
RDW: 12.3 % (ref 11.0–15.0)
WBC: 6.3 10*3/uL (ref 3.8–10.8)

## 2021-07-15 ENCOUNTER — Other Ambulatory Visit: Payer: Self-pay | Admitting: Family Medicine

## 2021-07-15 DIAGNOSIS — I10 Essential (primary) hypertension: Secondary | ICD-10-CM

## 2021-12-05 ENCOUNTER — Other Ambulatory Visit: Payer: Self-pay | Admitting: Urology

## 2021-12-05 DIAGNOSIS — N50812 Left testicular pain: Secondary | ICD-10-CM

## 2021-12-14 ENCOUNTER — Ambulatory Visit
Admission: RE | Admit: 2021-12-14 | Discharge: 2021-12-14 | Disposition: A | Discharge status: Home / Self Care | Payer: 59 | Source: Ambulatory Visit | Attending: Urology | Admitting: Urology

## 2021-12-14 DIAGNOSIS — N50812 Left testicular pain: Secondary | ICD-10-CM

## 2022-06-19 IMAGING — US US ABDOMEN LIMITED
1 series · 14 of 25 positions shown · non-contrast
Comparison: None.

CLINICAL DATA: Umbilical mass.

EXAM:
ULTRASOUND ABDOMEN LIMITED

[Series 1: us abdomen limited · 0.06mm/px · 29 acquisitions, 14 frames shown]
[im 1/29]
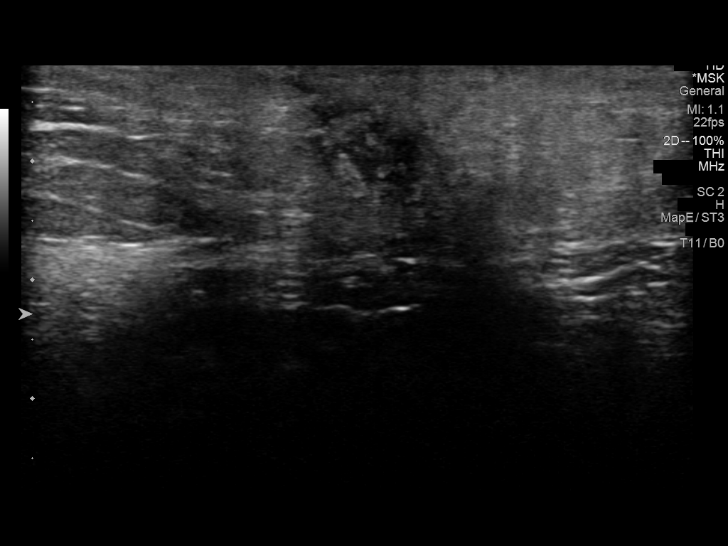
[im 3/29]
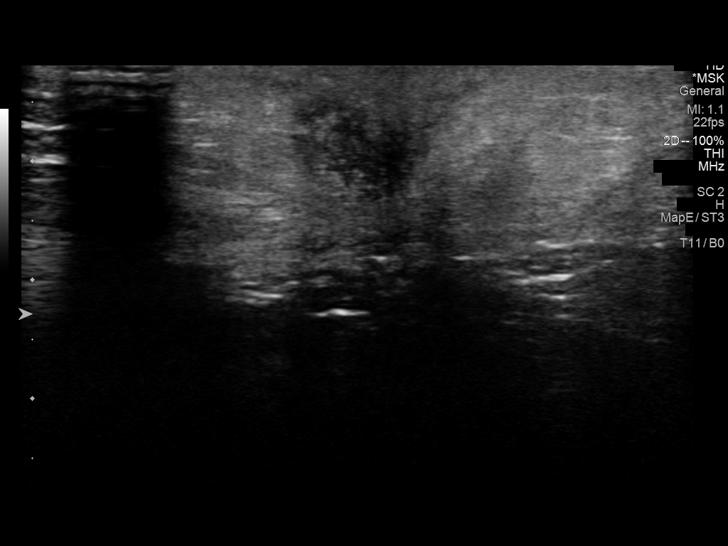
[im 5/29]
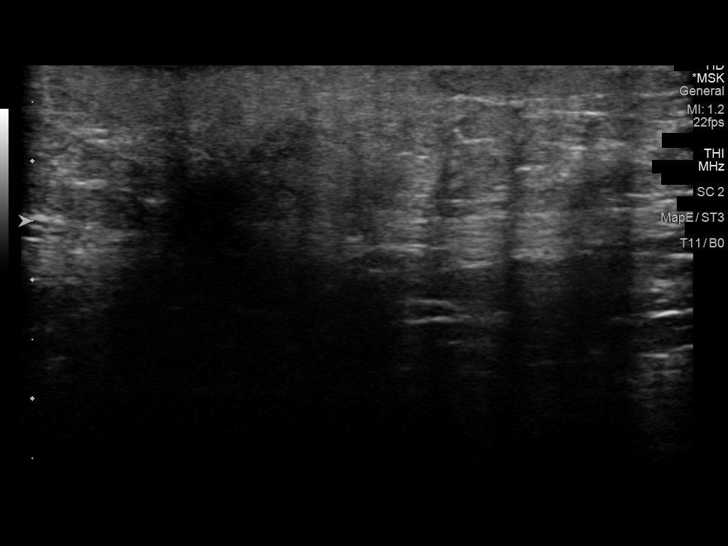
[im 8/29]
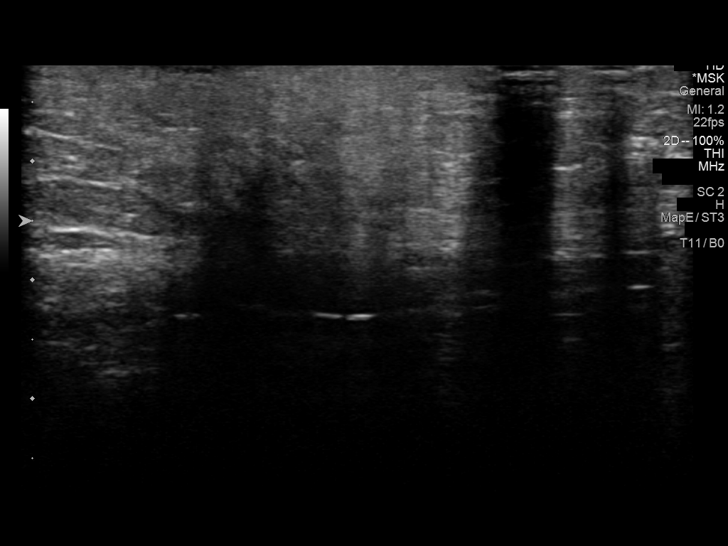
[im 10/29]
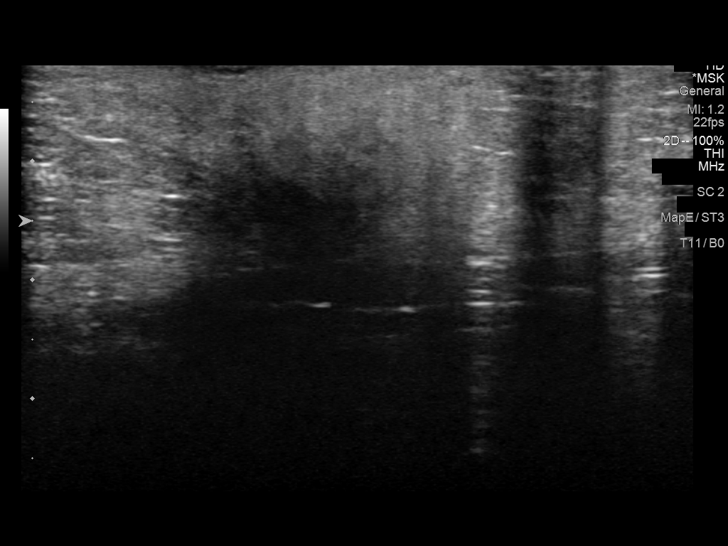
[im 11/29]
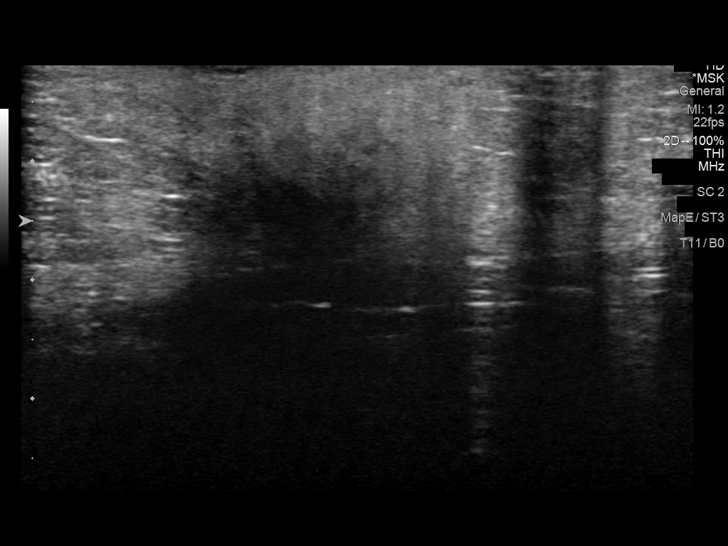
[im 13/29]
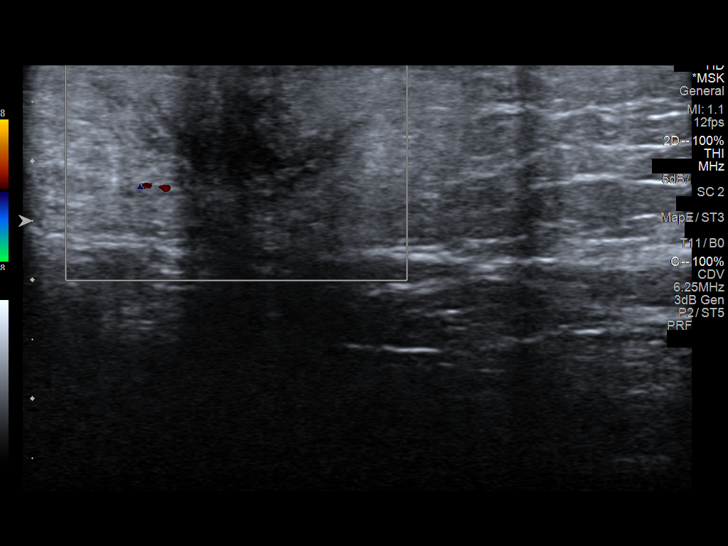
[im 16/29]
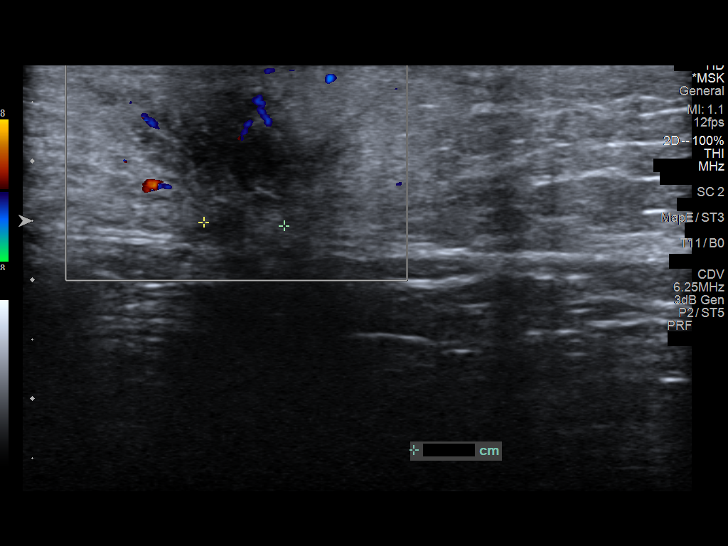
[im 18/29]
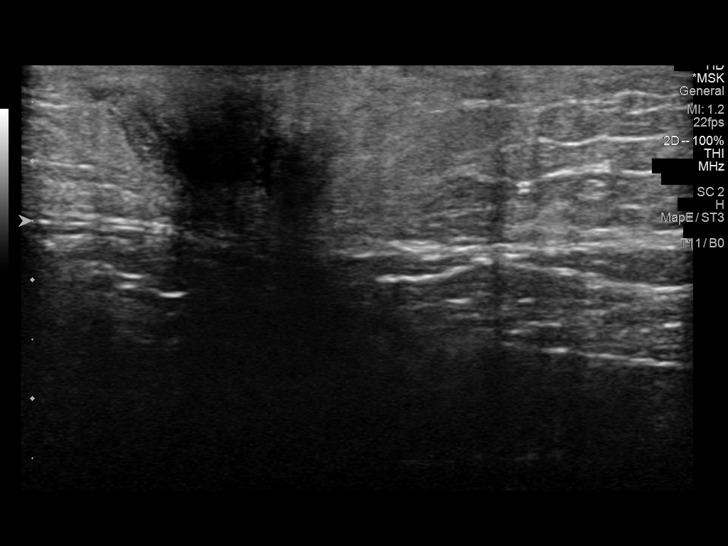
[im 19/29]
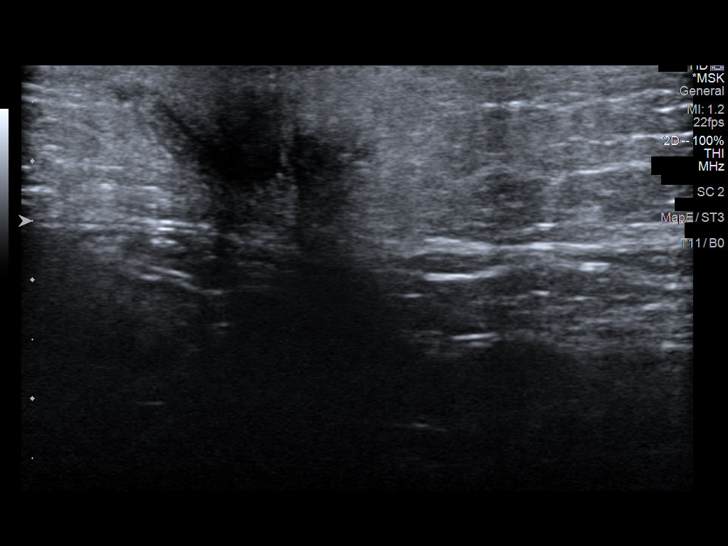
[im 22/29]
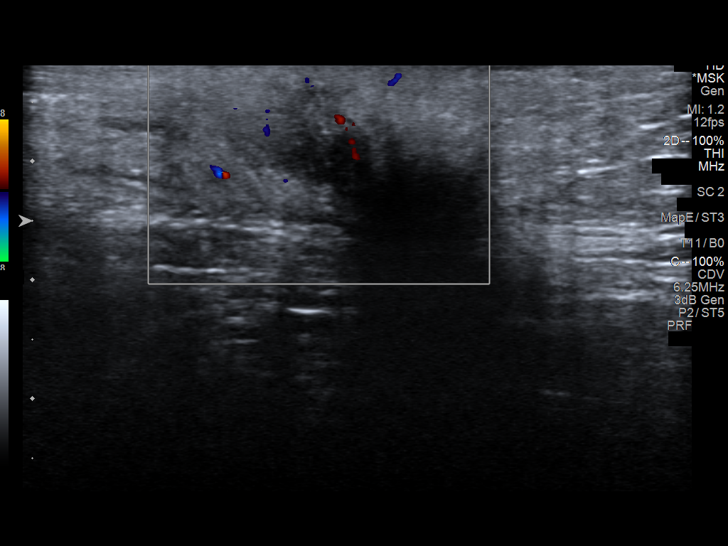
[im 24/29]
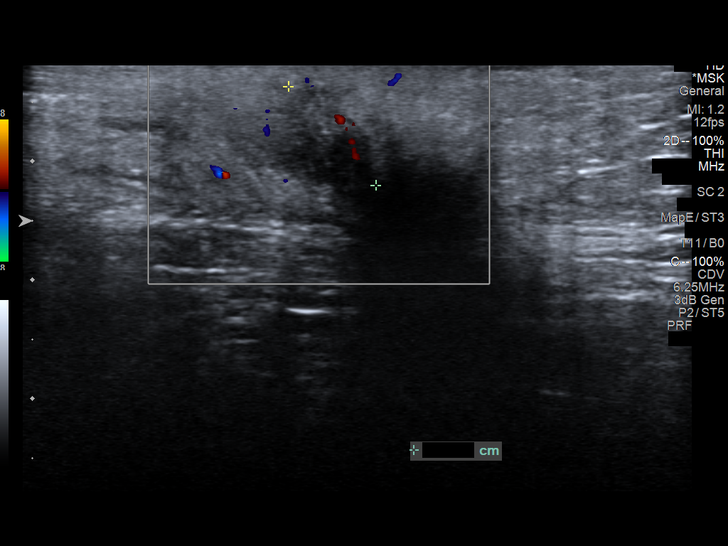
[im 26/29]
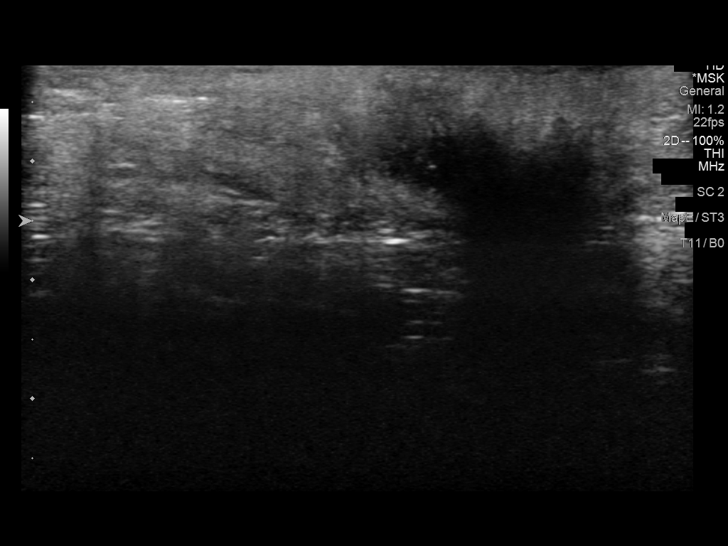
[im 29/29]
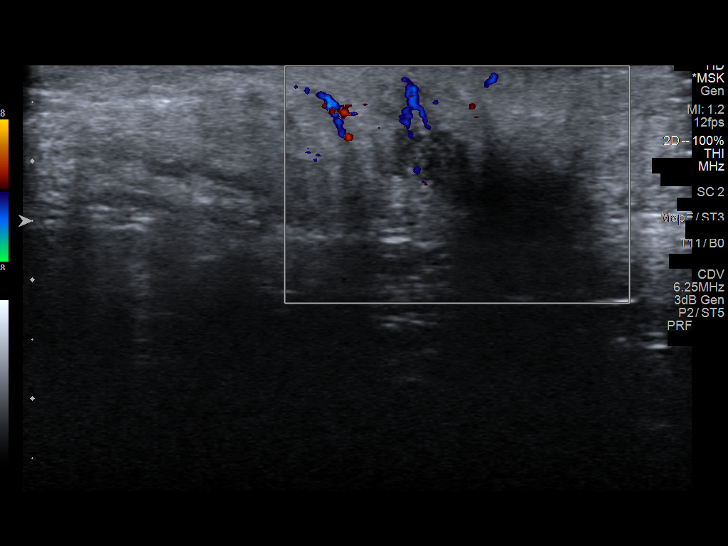

[14 of 25 positions shown; findings below may reference images not displayed]

FINDINGS: The patient's palpable area of concern corresponds to a small
heterogeneous hypoechoic masslike area that is ill-defined but
measures approximately 1.3 x 0.9 cm.
IMPRESSION: Findings are suspicious for an ill-defined periumbilical hernia.

## 2022-06-26 ENCOUNTER — Ambulatory Visit (INDEPENDENT_AMBULATORY_CARE_PROVIDER_SITE_OTHER): Payer: 59 | Admitting: Family Medicine

## 2022-06-26 ENCOUNTER — Encounter: Payer: Self-pay | Admitting: Family Medicine

## 2022-06-26 VITALS — BP 119/75 | HR 53 | Ht 71.0 in | Wt 230.0 lb

## 2022-06-26 DIAGNOSIS — I10 Essential (primary) hypertension: Secondary | ICD-10-CM | POA: Diagnosis not present

## 2022-06-26 DIAGNOSIS — Z13220 Encounter for screening for lipoid disorders: Secondary | ICD-10-CM | POA: Diagnosis not present

## 2022-06-26 DIAGNOSIS — Z0000 Encounter for general adult medical examination without abnormal findings: Secondary | ICD-10-CM | POA: Diagnosis not present

## 2022-06-26 DIAGNOSIS — R6882 Decreased libido: Secondary | ICD-10-CM | POA: Diagnosis not present

## 2022-06-26 NOTE — Assessment & Plan Note (Signed)
Blood pressures well controlled at this time.  Recommend continuation of lisinopril at current strength.

## 2022-06-26 NOTE — Progress Notes (Signed)
Spencer Jackson - 41 y.o. male MRN [id number]  Date of birth: November 18, 1980  Subjective Chief Complaint  Patient presents with   Hypertension    HPI Spencer Jackson is a 41 year old male here today for follow-up visit.  He was concerned that his blood pressure is running high.  Readings at home have been elevated.  He did bring his machine today to compare to reading in the office.  Reading in the office is normal.  His machine is reading a little high compared to this (approximately 15 points higher).  He denies symptoms related to hypertension including chest pain, shortness of breath, palpitations, headaches or vision changes.  He would like to have updated lab work completed.  He would like this to include testosterone as he has noted some decreased libido.  ROS:  A comprehensive ROS was completed and negative except as noted per HPI  Allergies  Allergen Reactions   Grass Pollen(K-O-R-T-Swt Vern)     Other reaction(s): eye redness    Past Medical History:  Diagnosis Date   Allergy    Dizziness    Hypertension    no medications at this time 09-25-17   PAC (premature atrial contraction)    PVC's (premature ventricular contractions)     Past Surgical History:  Procedure Laterality Date   back cyst resection      Social History   Socioeconomic History   Marital status: Married    Spouse name: Not on file   Number of children: Not on file   Years of education: Not on file   Highest education level: Not on file  Occupational History   Not on file  Tobacco Use   Smoking status: Former    Types: Cigarettes    Quit date: 11/25/2005    Years since quitting: 16.5   Smokeless tobacco: Never  Vaping Use   Vaping Use: Never used  Substance and Sexual Activity   Alcohol use: Yes    Alcohol/week: 9.0 standard drinks of alcohol    Types: 9 Cans of beer per week   Drug use: No   Sexual activity: Not on file  Other Topics Concern   Not on file  Social History Narrative   Not on file    Social Determinants of Health   Financial Resource Strain: Not on file  Food Insecurity: Not on file  Transportation Needs: Not on file  Physical Activity: Not on file  Stress: Not on file  Social Connections: Not on file    Family History  Problem Relation Age of Onset   Hepatitis Mother        autoimmune   Colon cancer Neg Hx    Esophageal cancer Neg Hx    Rectal cancer Neg Hx    Stomach cancer Neg Hx     Health Maintenance  Topic Date Due   INFLUENZA VACCINE  12/10/2022 (Originally 04/11/2022)   Hepatitis C Screening  06/27/2023 (Originally 06/21/1999)   COLONOSCOPY (Pts 45-63yr Insurance coverage will need to be confirmed)  09/25/2022   TETANUS/TDAP  05/30/2026   COVID-19 Vaccine  Completed   HIV Screening  Completed   HPV VACCINES  Aged Out     ----------------------------------------------------------------------------------------------------------------------------------------------------------------------------------------------------------------- Physical Exam BP 119/75 (BP Location: Left Arm, Patient Position: Sitting, Cuff Size: Large)   Pulse (!) 53   Ht '5\' 11"'$  (1.803 m)   Wt 230 lb (104.3 kg)   SpO2 99%   BMI 32.08 kg/m   Physical Exam Constitutional:      Appearance: Normal appearance.  Cardiovascular:     Rate and Rhythm: Normal rate and regular rhythm.  Pulmonary:     Effort: Pulmonary effort is normal.     Breath sounds: Normal breath sounds.  Musculoskeletal:     Cervical back: Neck supple.  Neurological:     Mental Status: He is alert.  Psychiatric:        Mood and Affect: Mood normal.        Behavior: Behavior normal.     ------------------------------------------------------------------------------------------------------------------------------------------------------------------------------------------------------------------- Assessment and Plan  Hypertension Blood pressures well controlled at this time.  Recommend  continuation of lisinopril at current strength.  Decreased libido Checking testosterone levels.   No orders of the defined types were placed in this encounter.   No follow-ups on file.    This visit occurred during the SARS-CoV-2 public health emergency.  Safety protocols were in place, including screening questions prior to the visit, additional usage of staff PPE, and extensive cleaning of exam room while observing appropriate contact time as indicated for disinfecting solutions.

## 2022-06-26 NOTE — Assessment & Plan Note (Signed)
Checking testosterone levels.

## 2022-07-12 LAB — TSH: TSH: 1.77 mIU/L (ref 0.40–4.50)

## 2022-07-12 LAB — COMPLETE METABOLIC PANEL WITH GFR
AG Ratio: 1.5 (calc) (ref 1.0–2.5)
ALT: 17 U/L (ref 9–46)
AST: 24 U/L (ref 10–40)
Albumin: 4.1 g/dL (ref 3.6–5.1)
Alkaline phosphatase (APISO): 48 U/L (ref 36–130)
BUN: 16 mg/dL (ref 7–25)
CO2: 25 mmol/L (ref 20–32)
Calcium: 8.9 mg/dL (ref 8.6–10.3)
Chloride: 104 mmol/L (ref 98–110)
Creat: 0.89 mg/dL (ref 0.60–1.29)
Globulin: 2.7 g/dL (calc) (ref 1.9–3.7)
Glucose, Bld: 94 mg/dL (ref 65–99)
Potassium: 4.5 mmol/L (ref 3.5–5.3)
Sodium: 137 mmol/L (ref 135–146)
Total Bilirubin: 0.7 mg/dL (ref 0.2–1.2)
Total Protein: 6.8 g/dL (ref 6.1–8.1)
eGFR: 110 mL/min/{1.73_m2} (ref >=60)

## 2022-07-12 LAB — LIPID PANEL W/REFLEX DIRECT LDL
Cholesterol: 158 mg/dL (ref <200)
HDL: 60 mg/dL (ref >=40)
LDL Cholesterol (Calc): 83 mg/dL (calc)
Non-HDL Cholesterol (Calc): 98 mg/dL (calc) (ref <130)
Total CHOL/HDL Ratio: 2.6 (calc) (ref <5.0)
Triglycerides: 71 mg/dL (ref <150)

## 2022-07-12 LAB — CBC WITH DIFFERENTIAL/PLATELET
Absolute Monocytes: 459 cells/uL (ref 200–950)
Basophils Absolute: 20 cells/uL (ref 0–200)
Basophils Relative: 0.4 %
Eosinophils Absolute: 199 cells/uL (ref 15–500)
Eosinophils Relative: 3.9 %
HCT: 43.3 % (ref 38.5–50.0)
Hemoglobin: 14.9 g/dL (ref 13.2–17.1)
Lymphs Abs: 1673 cells/uL (ref 850–3900)
MCH: 31.1 pg (ref 27.0–33.0)
MCHC: 34.4 g/dL (ref 32.0–36.0)
MCV: 90.4 fL (ref 80.0–100.0)
MPV: 9.6 fL (ref 7.5–12.5)
Monocytes Relative: 9 %
Neutro Abs: 2749 cells/uL (ref 1500–7800)
Neutrophils Relative %: 53.9 %
Platelets: 237 10*3/uL (ref 140–400)
RBC: 4.79 10*6/uL (ref 4.20–5.80)
RDW: 12.1 % (ref 11.0–15.0)
Total Lymphocyte: 32.8 %
WBC: 5.1 10*3/uL (ref 3.8–10.8)

## 2022-07-12 LAB — VITAMIN D 25 HYDROXY (VIT D DEFICIENCY, FRACTURES): Vit D, 25-Hydroxy: 28 ng/mL — ABNORMAL LOW (ref 30–100)

## 2022-07-12 LAB — TESTOSTERONE: Testosterone: 569 ng/dL (ref 250–827)

## 2022-09-05 ENCOUNTER — Encounter: Payer: Self-pay | Admitting: Family Medicine

## 2022-10-30 IMAGING — CT CT CARDIAC CORONARY ARTERY CALCIUM SCORE
3 series · 14 of 20 positions shown, 15 images · non-contrast
Comparison: None.
COMPARISON: None.

Addendum:
EXAM:
OVER-READ INTERPRETATION  CT CHEST

The following report is an over-read performed by radiologist Dr.
Znaidi Dj [REDACTED] on 07/16/2020. This
over-read does not include interpretation of cardiac or coronary
anatomy or pathology. The coronary calcium score interpretation by
the cardiologist is attached.
CLINICAL DATA: Risk stratification
Coronary Calcium Score
TECHNIQUE: The patient was scanned on a Siemens Force scanner. Axial
non-contrast 3 mm slices were carried out through the heart. The
data set was analyzed on a dedicated work station and scored using
the Agatson method.

[Series 2: casc 3.0 bv41 2 bestdiast 70 % · axial · 0.36mm/px · z∈[-220,-157]mm · 4 of 36 slices shown, 5 images]
[im 8/36  vessel]
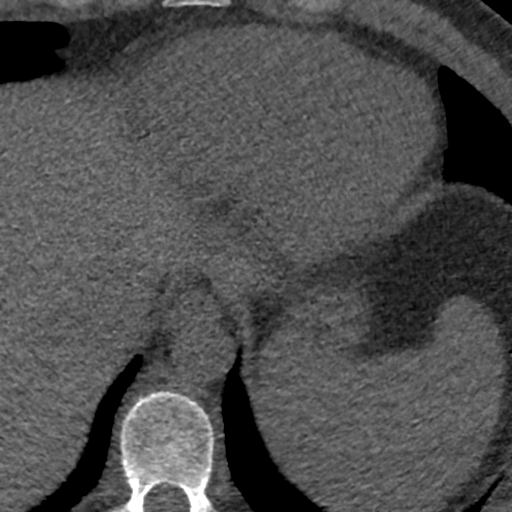
[im 8/36  lung]
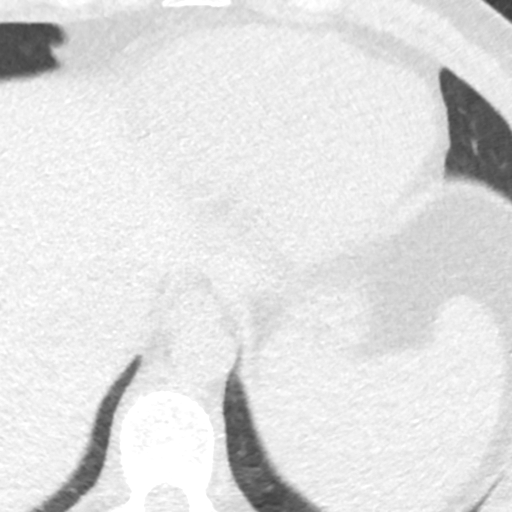
[im 15/36  vessel]
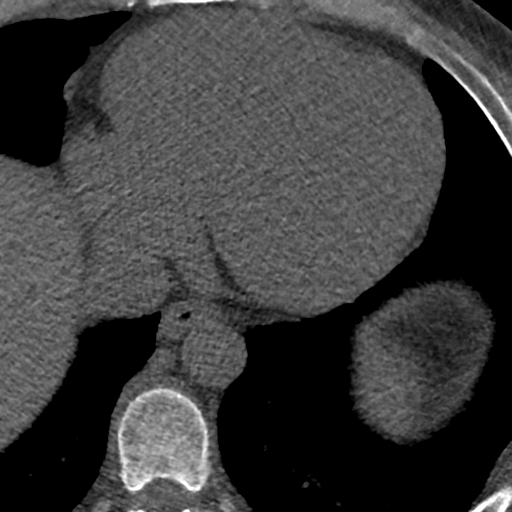
[im 22/36  vessel]
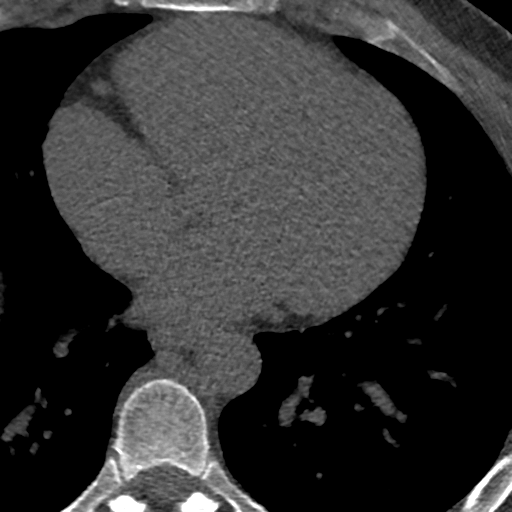
[im 29/36  vessel]
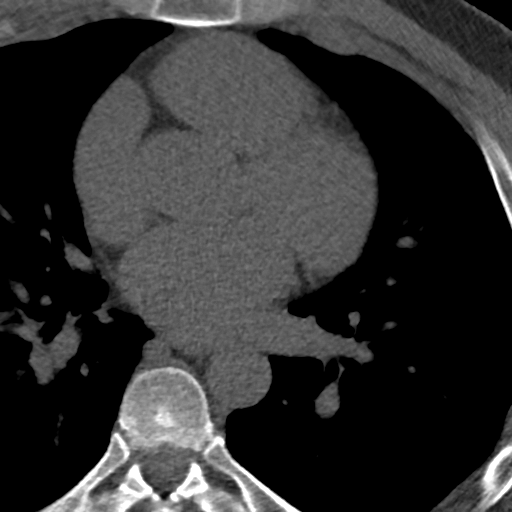

[Series 3: lung 69 % · axial · 0.66mm/px · z∈[-226,-154]mm · 5 of 36 slices shown]
[im 6/36  lung]
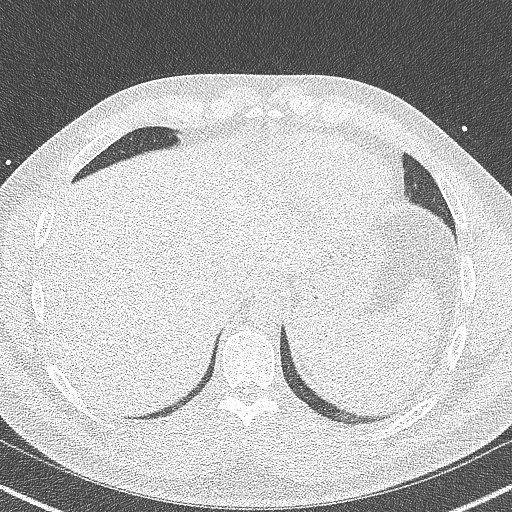
[im 12/36  lung]
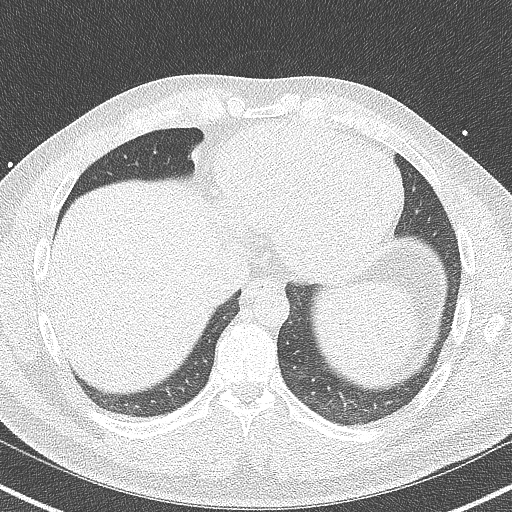
[im 18/36  lung]
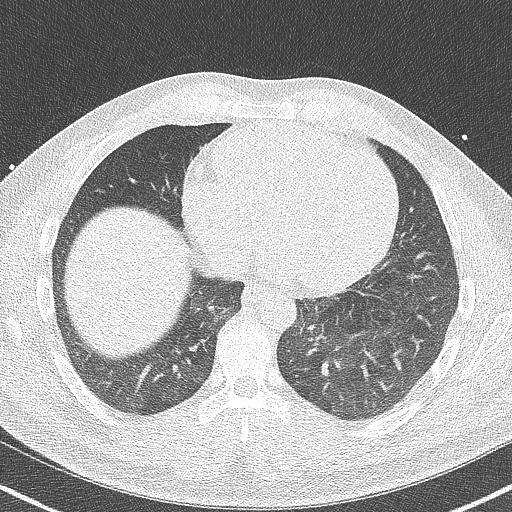
[im 24/36  lung]
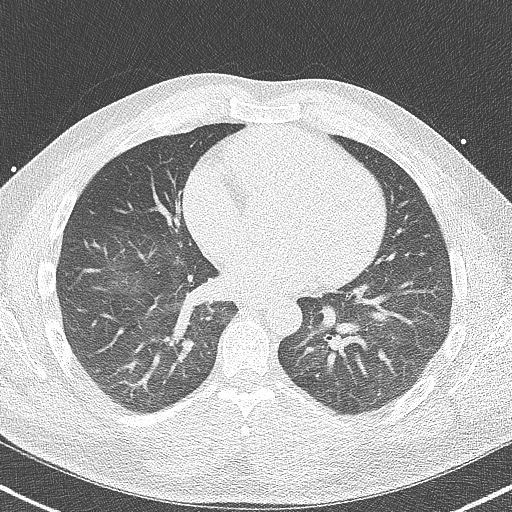
[im 30/36  lung]
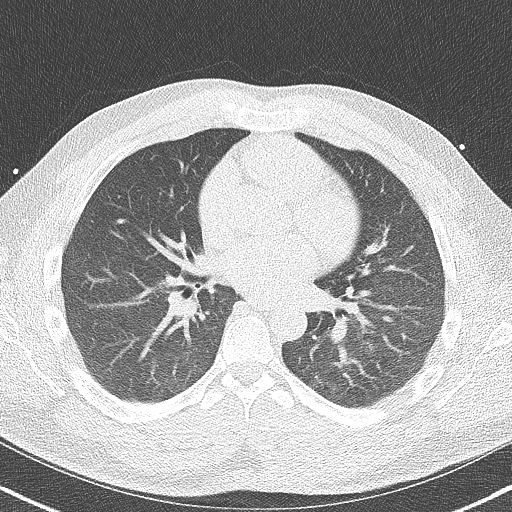

[Series 4: lung st 69 % · axial · 0.66mm/px · z∈[-226,-154]mm · 5 of 36 slices shown]
[im 6/36  lung]
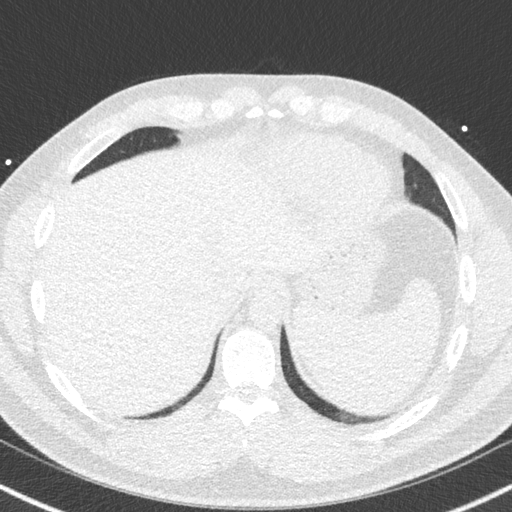
[im 12/36  lung]
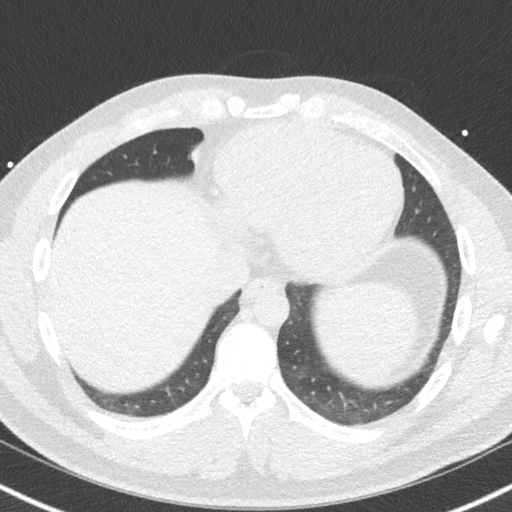
[im 18/36  lung]
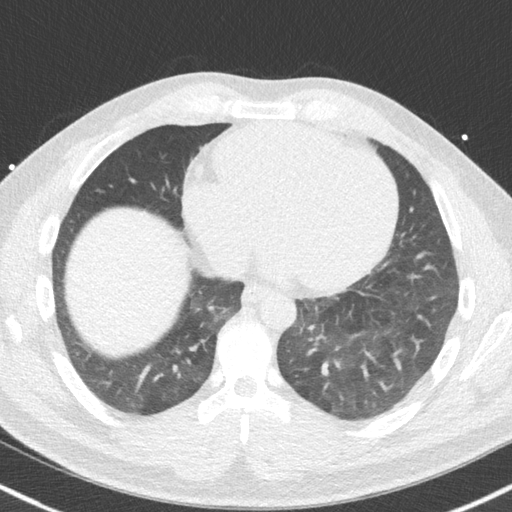
[im 24/36  lung]
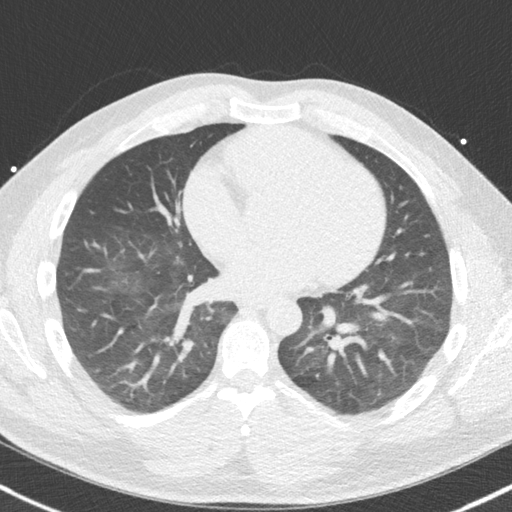
[im 30/36  lung]
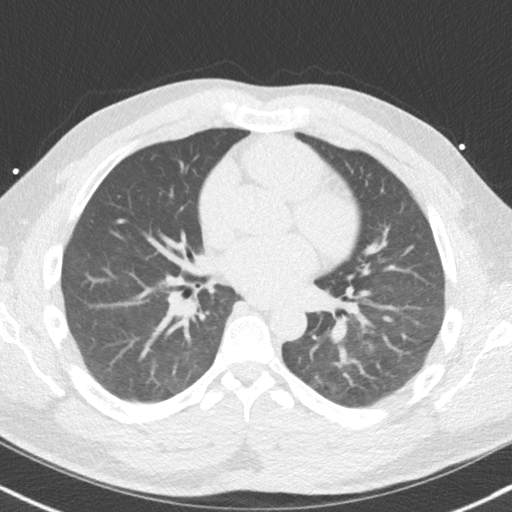

[14 of 20 positions shown; findings below may reference images not displayed]

FINDINGS: Within the visualized portions of the thorax there are no suspicious
appearing pulmonary nodules or masses, there is no acute
consolidative airspace disease, no pleural effusions, no
pneumothorax and no lymphadenopathy. Visualized portions of the
upper abdomen are unremarkable. There are no aggressive appearing
lytic or blastic lesions noted in the visualized portions of the
skeleton.
IMPRESSION: No significant incidental noncardiac findings are noted.
FINDINGS: Non-cardiac: See separate report from [REDACTED].

Ascending Aorta: Normal caliber

Pericardium: Normal

Coronary arteries: Normal origins
IMPRESSION: Coronary calcium score of 0.  This is a low risk study.

*** End of Addendum ***
EXAM:
OVER-READ INTERPRETATION  CT CHEST

The following report is an over-read performed by radiologist Dr.
Znaidi Dj [REDACTED] on 07/16/2020. This
over-read does not include interpretation of cardiac or coronary
anatomy or pathology. The coronary calcium score interpretation by
the cardiologist is attached.
FINDINGS: Within the visualized portions of the thorax there are no suspicious
appearing pulmonary nodules or masses, there is no acute
consolidative airspace disease, no pleural effusions, no
pneumothorax and no lymphadenopathy. Visualized portions of the
upper abdomen are unremarkable. There are no aggressive appearing
lytic or blastic lesions noted in the visualized portions of the
skeleton.
IMPRESSION: No significant incidental noncardiac findings are noted.

## 2022-12-29 ENCOUNTER — Encounter: Payer: Self-pay | Admitting: Internal Medicine

## 2023-01-30 ENCOUNTER — Ambulatory Visit (AMBULATORY_SURGERY_CENTER): Payer: 59 | Admitting: *Deleted

## 2023-01-30 VITALS — Ht 71.0 in | Wt 220.0 lb

## 2023-01-30 DIAGNOSIS — Z86010 Personal history of colonic polyps: Secondary | ICD-10-CM

## 2023-01-30 MED ORDER — NA SULFATE-K SULFATE-MG SULF 17.5-3.13-1.6 GM/177ML PO SOLN: 1.0000 | Freq: Once | ORAL | 0 refills | Status: AC

## 2023-01-30 NOTE — Progress Notes (Signed)
Pt's name and DOB verified at the beginning of the pre-visit.  Pt denies any difficulty with ambulating,sitting, laying down or rolling side to side Gave both [facility] main # and MD on call # prior to instructions.  No egg or soy allergy known to patient  No issues known to pt with past sedation with any surgeries or procedures Patient denies ever being intubated Pt has no issues moving head neck or swallowing No FH of Malignant Hyperthermia Pt is not on diet pills Pt is not on home 02  Pt is not on blood thinners  Pt denies issues with constipation  Pt is not on dialysis Pt denise any abnormal heart rhythms  Pt denies any upcoming cardiac testing Pt encouraged to use to use Singlecare or Goodrx to reduce cost  Patient's chart reviewed by Cathlyn Parsons CNRA prior to pre-visit and patient appropriate for the [facility].  Pre-visit completed and red dot placed by patient's name on their procedure day (on provider's schedule).  . Visit by phone Pt states weight is 220 lb Instructed pt why it is important to and  to call if they have any changes in health or new medications. Directed them to the # given and on instructions.   Pt states they will.  Instructions reviewed with pt and pt states understanding. Instructed to review again prior to procedure. Pt states they will.  Instructions sent by mail with coupon and by my chart

## 2023-02-08 ENCOUNTER — Encounter: Payer: Self-pay | Admitting: Internal Medicine

## 2023-02-19 ENCOUNTER — Ambulatory Visit (AMBULATORY_SURGERY_CENTER): Payer: 59 | Admitting: Internal Medicine

## 2023-02-19 ENCOUNTER — Encounter: Payer: Self-pay | Admitting: Internal Medicine

## 2023-02-19 VITALS — BP 110/77 | HR 50 | Temp 97.3°F | Resp 12 | Ht 71.0 in | Wt 220.0 lb

## 2023-02-19 DIAGNOSIS — Z86010 Personal history of colonic polyps: Secondary | ICD-10-CM | POA: Diagnosis not present

## 2023-02-19 DIAGNOSIS — Z09 Encounter for follow-up examination after completed treatment for conditions other than malignant neoplasm: Secondary | ICD-10-CM | POA: Diagnosis present

## 2023-02-19 MED ORDER — SODIUM CHLORIDE 0.9 % IV SOLN: 500.0000 mL | INTRAVENOUS | Status: DC

## 2023-02-19 NOTE — Patient Instructions (Signed)
Resume all of your previous medications as ordered.  Read the handouts given to you by your recovery room nurse.  YOU HAD AN ENDOSCOPIC PROCEDURE TODAY AT THE [facility]:   Refer to the procedure report that was given to you for any specific questions about what was found during the examination.  If the procedure report does not answer your questions, please call your gastroenterologist to clarify.  If you requested that your care partner not be given the details of your procedure findings, then the procedure report has been included in a sealed envelope for you to review at your convenience later.  YOU SHOULD EXPECT: Some feelings of bloating in the abdomen. Passage of more gas than usual.  Walking can help get rid of the air that was put into your GI tract during the procedure and reduce the bloating. If you had a lower endoscopy (such as a colonoscopy or flexible sigmoidoscopy) you may notice spotting of blood in your stool or on the toilet paper. If you underwent a bowel prep for your procedure, you may not have a normal bowel movement for a few days.  Please Note:  You might notice some irritation and congestion in your nose or some drainage.  This is from the oxygen used during your procedure.  There is no need for concern and it should clear up in a day or so.  SYMPTOMS TO REPORT IMMEDIATELY:  Following lower endoscopy (colonoscopy or flexible sigmoidoscopy):  Excessive amounts of blood in the stool  Significant tenderness or worsening of abdominal pains  Swelling of the abdomen that is new, acute  Fever of 100F or higher   For urgent or emergent issues, a gastroenterologist can be reached at any hour by calling [phone]. Do not use MyChart messaging for urgent concerns.    DIET:  We do recommend a small meal at first, but then you may proceed to your regular diet.  Drink plenty of fluids but you should avoid alcoholic beverages for 24 hours.  ACTIVITY:  You should  plan to take it easy for the rest of today and you should NOT DRIVE or use heavy machinery until tomorrow (because of the sedation medicines used during the test).    FOLLOW UP: Our staff will call the number listed on your records the next business day following your procedure.  We will call around 7:15- 8:00 am to check on you and address any questions or concerns that you may have regarding the information given to you following your procedure. If we do not reach you, we will leave a message.      SIGNATURES/CONFIDENTIALITY: You and/or your care partner have signed paperwork which will be entered into your electronic medical record.  These signatures attest to the fact that that the information above on your After Visit Summary has been reviewed and is understood.  Full responsibility of the confidentiality of this discharge information lies with you and/or your care-partner.

## 2023-02-19 NOTE — Progress Notes (Signed)
Sedate, gd SR, tolerated procedure well, VSS, report to RN 

## 2023-02-19 NOTE — Progress Notes (Signed)
Patient states there have been no changes to medical or surgical history since time of pre-visit. 

## 2023-02-19 NOTE — Progress Notes (Signed)
HISTORY OF PRESENT ILLNESS:  Spencer Jackson is a 42 y.o. male with a history of sessile serrated polyps.  Presents today for surveillance colonoscopy.  No complaints  REVIEW OF SYSTEMS:  All non-GI ROS negative except for  Past Medical History:  Diagnosis Date   Allergy    Dizziness    GERD (gastroesophageal reflux disease)    Hypertension    no medications at this time 09-25-17   PAC (premature atrial contraction)    PVC's (premature ventricular contractions)     Past Surgical History:  Procedure Laterality Date   back cyst resection     COLONOSCOPY      Social History Oz Gammel  reports that he quit smoking about 17 years ago. His smoking use included cigarettes. He has never used smokeless tobacco. He reports current alcohol use of about 9.0 standard drinks of alcohol per week. He reports that he does not currently use drugs after having used the following drugs: Marijuana.  family history includes Hepatitis in his mother.  Allergies  Allergen Reactions   Grass Pollen(K-O-R-T-Swt Vern)     Other reaction(s): eye redness       PHYSICAL EXAMINATION: Vital signs: BP 128/77   Pulse (!) 44   Temp (!) 97.3 F (36.3 C) (Skin)   Ht 5\' 11"  (1.803 m)   Wt 220 lb (99.8 kg)   SpO2 99%   BMI 30.68 kg/m  General: Well-developed, well-nourished, no acute distress HEENT: Sclerae are anicteric, conjunctiva pink. Oral mucosa intact Lungs: Clear Heart: Regular Abdomen: soft, nontender, nondistended, no obvious ascites, no peritoneal signs, normal bowel sounds. No organomegaly. Extremities: No edema Psychiatric: alert and oriented x3. Cooperative     ASSESSMENT:  Personal history of sessile serrated polyps   PLAN:   Surveillance colonoscopy

## 2023-02-19 NOTE — Op Note (Signed)
Spencer Jackson Patient Name: Spencer Jackson Procedure Date: 02/19/2023 9:29 AM MRN: [id number] Endoscopist: Spencer Jackson , MD, [id number] Age: 42 Referring MD:  Date of Birth: Jun 26, 1981 Gender: Male Account #: [id number] Procedure:                Colonoscopy Indications:              High risk colon cancer surveillance: Personal                            history of sessile serrated colon polyps (less than                            10 mm in size) with no dysplasia. Previous exam 2019 Medicines:                Monitored Anesthesia Care Procedure:                Pre-Anesthesia Assessment:                           - Prior to the procedure, a History and Physical                            was performed, and patient medications and                            allergies were reviewed. The patient's tolerance of                            previous anesthesia was also reviewed. The risks                            and benefits of the procedure and the sedation                            options and risks were discussed with the patient.                            All questions were answered, and informed consent                            was obtained. Prior Anticoagulants: The patient has                            taken no anticoagulant or antiplatelet agents.                            After reviewing the risks and benefits, the patient                            was deemed in satisfactory condition to undergo the                            procedure.  After obtaining informed consent, the colonoscope                            was passed under direct vision. Throughout the                            procedure, the patient's blood pressure, pulse, and                            oxygen saturations were monitored continuously. The                            CF [id number] #[id number] was introduced through the anus                            and advanced to the the  cecum, identified by                            appendiceal orifice and ileocecal valve. The                            ileocecal valve, appendiceal orifice, and rectum                            were photographed. The quality of the bowel                            preparation was excellent. The colonoscopy was                            performed without difficulty. The patient tolerated                            the procedure well. The bowel preparation used was                            SUPREP via split dose instruction. Scope In: 9:38:48 AM Scope Out: 9:51:56 AM Scope Withdrawal Time: 0 hours 9 minutes 54 seconds  Total Procedure Duration: 0 hours 13 minutes 8 seconds  Findings:                 The entire examined colon appeared normal on direct                            and retroflexion views. Complications:            No immediate complications. Estimated blood loss:                            None. Estimated Blood Loss:     Estimated blood loss: none. Impression:               - The entire examined colon is normal on direct and  retroflexion views.                           - No specimens collected. Recommendation:           - Repeat colonoscopy in 10 years for surveillance.                           - Patient has a contact number available for                            emergencies. The signs and symptoms of potential                            delayed complications were discussed with the                            patient. Return to normal activities tomorrow.                            Written discharge instructions were provided to the                            patient.                           - Resume previous diet.                           - Continue present medications. Spencer Bonito. Marina Goodell, MD 02/19/2023 9:58:02 AM This report has been signed electronically.

## 2023-02-20 ENCOUNTER — Telehealth: Payer: Self-pay | Admitting: *Deleted

## 2023-02-20 NOTE — Telephone Encounter (Signed)
Left message on f/u call 

## 2023-07-31 ENCOUNTER — Encounter: Payer: Self-pay | Admitting: Family Medicine

## 2023-07-31 ENCOUNTER — Ambulatory Visit (INDEPENDENT_AMBULATORY_CARE_PROVIDER_SITE_OTHER): Payer: 59 | Admitting: Family Medicine

## 2023-07-31 VITALS — BP 121/83 | HR 49 | Ht 71.0 in | Wt 225.0 lb

## 2023-07-31 DIAGNOSIS — Z0000 Encounter for general adult medical examination without abnormal findings: Secondary | ICD-10-CM

## 2023-07-31 DIAGNOSIS — I10 Essential (primary) hypertension: Secondary | ICD-10-CM | POA: Diagnosis not present

## 2023-07-31 DIAGNOSIS — R6882 Decreased libido: Secondary | ICD-10-CM | POA: Diagnosis not present

## 2023-07-31 DIAGNOSIS — E559 Vitamin D deficiency, unspecified: Secondary | ICD-10-CM

## 2023-07-31 DIAGNOSIS — Z1159 Encounter for screening for other viral diseases: Secondary | ICD-10-CM

## 2023-07-31 DIAGNOSIS — Z23 Encounter for immunization: Secondary | ICD-10-CM

## 2023-07-31 DIAGNOSIS — Z13220 Encounter for screening for lipoid disorders: Secondary | ICD-10-CM

## 2023-07-31 NOTE — Assessment & Plan Note (Addendum)
Well adult Orders Placed This Encounter  Procedures   Flu vaccine trivalent PF, 6mos and older(Flulaval,Afluria,Fluarix,Fluzone)   Pfizer Comirnaty Covid -19 Vaccine 1yrs and older   CMP14+EGFR   CBC with Differential/Platelet   Lipid Panel With LDL/HDL Ratio   Vitamin D (25 hydroxy)   Hepatitis C Antibody   Testosterone   TSH  Screening: UTD Immunization:  Flu and COVID vaccine given today.  Anticipatory guidance/Risk factor reduction:  Counseled on continuing to reduce EtOH intake, healthy diet and regular exercise. Additional recommendations per AVS.

## 2023-07-31 NOTE — Assessment & Plan Note (Signed)
BP has been well controlled.  He does have some intermittent dizziness. He can do a trial off lisinopril and monitor at home.

## 2023-07-31 NOTE — Patient Instructions (Signed)

## 2023-07-31 NOTE — Progress Notes (Signed)
Spencer Jackson - 42 y.o. male MRN [id number]  Date of birth: [birth year]  Subjective Chief Complaint  Patient presents with  . Annual Exam    HPI Spencer Jackson is a 42 y.o. male here today for annual exam.   Reports that he is doing well.   He remains fairly active and feels that his diet is good.   He is a non-smoker.  Moderate EtOH consumption.   Review of Systems  Constitutional:  Negative for chills, fever, malaise/fatigue and weight loss.  HENT:  Negative for congestion, ear pain and sore throat.   Eyes:  Negative for blurred vision, double vision and pain.  Respiratory:  Negative for cough and shortness of breath.   Cardiovascular:  Negative for chest pain and palpitations.  Gastrointestinal:  Negative for abdominal pain, blood in stool, constipation, heartburn and nausea.  Genitourinary:  Negative for dysuria and urgency.  Musculoskeletal:  Negative for joint pain and myalgias.  Neurological:  Negative for dizziness and headaches.  Endo/Heme/Allergies:  Does not bruise/bleed easily.  Psychiatric/Behavioral:  Negative for depression. The patient is not nervous/anxious and does not have insomnia.     Allergies  Allergen Reactions  . Grass Pollen(K-O-R-T-Swt Vern)     Other reaction(s): eye redness    Past Medical History:  Diagnosis Date  . Allergy   . Dizziness   . GERD (gastroesophageal reflux disease)   . Hypertension    no medications at this time 09-25-17  . PAC (premature atrial contraction)   . PVC's (premature ventricular contractions)     Past Surgical History:  Procedure Laterality Date  . back cyst resection    . COLONOSCOPY      Social History   Socioeconomic History  . Marital status: Married    Spouse name: Not on file  . Number of children: Not on file  . Years of education: Not on file  . Highest education level: Bachelor's degree (e.g., BA, AB, BS)  Occupational History  . Not on file  Tobacco Use  . Smoking status: Former    Current  packs/day: 0.00    Types: Cigarettes    Quit date: 11/25/2005    Years since quitting: 17.6  . Smokeless tobacco: Never  Vaping Use  . Vaping status: Never Used  Substance and Sexual Activity  . Alcohol use: Yes    Alcohol/week: 9.0 standard drinks of alcohol    Types: 9 Cans of beer per week    Comment: 4-5 x a month  . Drug use: Not Currently    Types: Marijuana  . Sexual activity: Not on file  Other Topics Concern  . Not on file  Social History Narrative  . Not on file   Social Determinants of Health   Financial Resource Strain: Low Risk  (07/30/2023)   Overall Financial Resource Strain (CARDIA)   . Difficulty of Paying Living Expenses: Not hard at all  Food Insecurity: No Food Insecurity (07/30/2023)   Hunger Vital Sign   . Worried About Programme researcher, broadcasting/film/video in the Last Year: Never true   . Ran Out of Food in the Last Year: Never true  Transportation Needs: No Transportation Needs (07/30/2023)   PRAPARE - Transportation   . Lack of Transportation (Medical): No   . Lack of Transportation (Non-Medical): No  Physical Activity: Sufficiently Active (07/30/2023)   Exercise Vital Sign   . Days of Exercise per Week: 4 days   . Minutes of Exercise per Session: 90 min  Stress: Stress Concern  Present (07/30/2023)   [employer] of Occupational Health - Occupational Stress Questionnaire   . Feeling of Stress : To some extent  Social Connections: Socially Isolated (07/30/2023)   Social Connection and Isolation Panel [NHANES]   . Frequency of Communication with Friends and Family: Never   . Frequency of Social Gatherings with Friends and Family: Never   . Attends Religious Services: Never   . Active Member of Clubs or Organizations: No   . Attends Banker Meetings: Not on file   . Marital Status: Married    Family History  Problem Relation Age of Onset  . Hepatitis Mother        autoimmune  . Colon cancer Neg Hx   . Esophageal cancer Neg Hx   . Rectal  cancer Neg Hx   . Stomach cancer Neg Hx   . Colon polyps Neg Hx     Health Maintenance  Topic Date Due  . Hepatitis C Screening  Never done  . INFLUENZA VACCINE  04/12/2023  . COVID-19 Vaccine (4 - 2023-24 season) 05/13/2023  . DTaP/Tdap/Td (2 - Td or Tdap) 05/30/2026  . HIV Screening  Completed  . HPV VACCINES  Aged Out  . Colonoscopy  Discontinued     ----------------------------------------------------------------------------------------------------------------------------------------------------------------------------------------------------------------- Physical Exam BP 121/83 (BP Location: Left Arm, Patient Position: Sitting, Cuff Size: Large)   Pulse (!) 49   Ht 5\' 11"  (1.803 m)   Wt 225 lb (102.1 kg)   SpO2 100%   BMI 31.38 kg/m   Physical Exam Constitutional:      General: He is not in acute distress. HENT:     Head: Normocephalic and atraumatic.     Right Ear: Tympanic membrane and external ear normal.     Left Ear: Tympanic membrane and external ear normal.  Eyes:     General: No scleral icterus. Neck:     Thyroid: No thyromegaly.  Cardiovascular:     Rate and Rhythm: Normal rate and regular rhythm.     Heart sounds: Normal heart sounds.  Pulmonary:     Effort: Pulmonary effort is normal.     Breath sounds: Normal breath sounds.  Abdominal:     General: Bowel sounds are normal. There is no distension.     Palpations: Abdomen is soft.     Tenderness: There is no abdominal tenderness. There is no guarding.  Musculoskeletal:     Cervical back: Normal range of motion.  Lymphadenopathy:     Cervical: No cervical adenopathy.  Skin:    General: Skin is warm and dry.     Findings: No rash.  Neurological:     Mental Status: He is alert and oriented to person, place, and time.     Cranial Nerves: No cranial nerve deficit.     Motor: No abnormal muscle tone.  Psychiatric:        Mood and Affect: Mood normal.        Behavior: Behavior normal.     ------------------------------------------------------------------------------------------------------------------------------------------------------------------------------------------------------------------- Assessment and Plan  Well adult exam Well adult Orders Placed This Encounter  Procedures  . Flu vaccine trivalent PF, 6mos and older(Flulaval,Afluria,Fluarix,Fluzone)  . Pfizer Comirnaty Covid -19 Vaccine 79yrs and older  . CMP14+EGFR  . CBC with Differential/Platelet  . Lipid Panel With LDL/HDL Ratio  . Vitamin D (25 hydroxy)  . Hepatitis C Antibody  . Testosterone  . TSH  Screening: UTD Immunization:  Flu and COVID vaccine given today.  Anticipatory guidance/Risk factor reduction:  Counseled on continuing to  reduce EtOH intake, healthy diet and regular exercise. Additional recommendations per AVS.   Hypertension BP has been well controlled.  He does have some intermittent dizziness. He can do a trial off lisinopril and monitor at home.    No orders of the defined types were placed in this encounter.   No follow-ups on file.    This visit occurred during the SARS-CoV-2 public health emergency.  Safety protocols were in place, including screening questions prior to the visit, additional usage of staff PPE, and extensive cleaning of exam room while observing appropriate contact time as indicated for disinfecting solutions.

## 2023-08-01 LAB — LIPID PANEL WITH LDL/HDL RATIO
Cholesterol, Total: 171 mg/dL (ref 100–199)
HDL: 64 mg/dL (ref >39)
LDL Chol Calc (NIH): 93 mg/dL (ref 0–99)
LDL/HDL Ratio: 1.5 ratio (ref 0.0–3.6)
Triglycerides: 75 mg/dL (ref 0–149)
VLDL Cholesterol Cal: 14 mg/dL (ref 5–40)

## 2023-08-01 LAB — CBC WITH DIFFERENTIAL/PLATELET
Basophils Absolute: 0 10*3/uL (ref 0.0–0.2)
Basos: 1 %
EOS (ABSOLUTE): 0.2 10*3/uL (ref 0.0–0.4)
Eos: 3 %
Hematocrit: 48.7 % (ref 37.5–51.0)
Hemoglobin: 15.5 g/dL (ref 13.0–17.7)
Immature Grans (Abs): 0 10*3/uL (ref 0.0–0.1)
Immature Granulocytes: 0 %
Lymphocytes Absolute: 1.8 10*3/uL (ref 0.7–3.1)
Lymphs: 31 %
MCH: 29.9 pg (ref 26.6–33.0)
MCHC: 31.8 g/dL (ref 31.5–35.7)
MCV: 94 fL (ref 79–97)
Monocytes Absolute: 0.5 10*3/uL (ref 0.1–0.9)
Monocytes: 9 %
Neutrophils Absolute: 3.4 10*3/uL (ref 1.4–7.0)
Neutrophils: 56 %
Platelets: 282 10*3/uL (ref 150–450)
RBC: 5.18 x10E6/uL (ref 4.14–5.80)
RDW: 12.2 % (ref 11.6–15.4)
WBC: 5.9 10*3/uL (ref 3.4–10.8)

## 2023-08-01 LAB — TESTOSTERONE: Testosterone: 525 ng/dL (ref 264–916)

## 2023-08-01 LAB — CMP14+EGFR
ALT: 21 [IU]/L (ref 0–44)
AST: 29 [IU]/L (ref 0–40)
Albumin: 4.6 g/dL (ref 4.1–5.1)
Alkaline Phosphatase: 64 [IU]/L (ref 44–121)
BUN/Creatinine Ratio: 16 (ref 9–20)
BUN: 16 mg/dL (ref 6–24)
Bilirubin Total: 0.4 mg/dL (ref 0.0–1.2)
CO2: 23 mmol/L (ref 20–29)
Calcium: 9 mg/dL (ref 8.7–10.2)
Chloride: 102 mmol/L (ref 96–106)
Creatinine, Ser: 0.97 mg/dL (ref 0.76–1.27)
Globulin, Total: 2.7 g/dL (ref 1.5–4.5)
Glucose: 93 mg/dL (ref 70–99)
Potassium: 4.6 mmol/L (ref 3.5–5.2)
Sodium: 139 mmol/L (ref 134–144)
Total Protein: 7.3 g/dL (ref 6.0–8.5)
eGFR: 100 mL/min/{1.73_m2} (ref >59)

## 2023-08-01 LAB — VITAMIN D 25 HYDROXY (VIT D DEFICIENCY, FRACTURES): Vit D, 25-Hydroxy: 64.4 ng/mL (ref 30.0–100.0)

## 2023-08-01 LAB — TSH: TSH: 2.21 u[IU]/mL (ref 0.450–4.500)

## 2023-08-01 LAB — HEPATITIS C ANTIBODY: Hep C Virus Ab: NONREACTIVE

## 2023-10-23 ENCOUNTER — Ambulatory Visit: Payer: Self-pay | Admitting: Family Medicine

## 2023-10-23 NOTE — Telephone Encounter (Signed)
  Chief Complaint: Flu exposure Symptoms: asymptomatic Pertinent Negatives: Patient denies symptoms Disposition: [] ED /[] Urgent Care (no appt availability in office) / [] Appointment(In office/virtual)/ []  [facility]/ [] Home Care/ [] Refused Recommended Disposition /[] [facility]/ [x]  Follow-up with PCP Additional Notes: patient called requesting prophylactic Tamiflu as both children tested positive for flu. First child tested positive this past Saturday and second child tested positive today. Patient is asymptomatic but is wanting Tamiflu as a preventative. Patient is requesting a message on Mychart as a follow up. Patient verbalized understanding and all questions answered.     Copied from CRM [id number]. Topic: Clinical - Medical Advice >> Oct 23, 2023  1:44 PM Ivette P wrote: Reason for CRM: Pt is calling in because he is at home and has both children at home sick with the Flu. Pt requesting callback [phone]  Spencer Jackson  06/20/2010  [name]  [birth year]/04/2012 Reason for Disposition  [1] Influenza EXPOSURE within last 48 hours (2 days) AND [2] NOT HIGH RISK AND [3] strongly requests antiviral medication  Answer Assessment - Initial Assessment Questions 1. TYPE of EXPOSURE: "How were you exposed?" (e.g., close contact, not a close contact)     Close contact 2. DATE of EXPOSURE: "When did the exposure occur?" (e.g., hour, days, weeks)     One child tested positive on Saturday and second child tested positive today 4. HIGH RISK for COMPLICATIONS: "Do you have any heart or lung problems?" "Do you have a weakened immune system?" (e.g., CHF, COPD, asthma, HIV positive, chemotherapy, renal failure, diabetes mellitus, sickle cell anemia)     NO 5. SYMPTOMS: "Do you have any symptoms?" (e.g., cough, fever, sore throat, difficulty breathing).     No symptoms  Protocols used: Influenza (Flu) Exposure-A-AH

## 2023-10-24 MED ORDER — OSELTAMIVIR PHOSPHATE 75 MG PO CAPS: 75.0000 mg | ORAL_CAPSULE | Freq: Every day | ORAL | 0 refills | Status: AC

## 2023-10-24 NOTE — Addendum Note (Signed)
Addended by: Mammie Lorenzo on: 10/24/2023 04:32 PM   Modules accepted: Orders

## 2023-10-25 NOTE — Telephone Encounter (Signed)
Called and left a voice mail message requesting a return call.

## 2023-10-26 NOTE — Telephone Encounter (Signed)
Spoke with patient wife - informed and will inform patient.

## 2023-11-08 ENCOUNTER — Encounter: Payer: Self-pay | Admitting: Internal Medicine

## 2023-12-17 ENCOUNTER — Encounter: Payer: Self-pay | Admitting: Gastroenterology

## 2023-12-17 ENCOUNTER — Ambulatory Visit (INDEPENDENT_AMBULATORY_CARE_PROVIDER_SITE_OTHER): Payer: 59 | Admitting: Gastroenterology

## 2023-12-17 VITALS — BP 124/78 | HR 60 | Ht 71.0 in | Wt 225.0 lb

## 2023-12-17 DIAGNOSIS — K625 Hemorrhage of anus and rectum: Secondary | ICD-10-CM | POA: Diagnosis not present

## 2023-12-17 DIAGNOSIS — K602 Anal fissure, unspecified: Secondary | ICD-10-CM

## 2023-12-17 DIAGNOSIS — K6289 Other specified diseases of anus and rectum: Secondary | ICD-10-CM

## 2023-12-17 DIAGNOSIS — Z8719 Personal history of other diseases of the digestive system: Secondary | ICD-10-CM

## 2023-12-17 MED ORDER — AMBULATORY NON FORMULARY MEDICATION: 1 refills | Status: DC

## 2023-12-17 NOTE — Progress Notes (Signed)
 Noted.

## 2023-12-17 NOTE — Patient Instructions (Signed)
 We have sent a prescription for Diltiazem 2% gel to [facility] for you. Using your index finger, you should apply a small amount of medication inside the rectum up to your first knuckle/joint   [facility] information is below: Address: [address], [place], [place] [address]  [phone]  *Please DO NOT go directly from our office to pick up this medication! Give the pharmacy 1 day to process the prescription as this is compounded and takes time to make..  Follow Up as needed  _______________________________________________________  If your blood pressure at your visit was 140/90 or greater, please contact your primary care physician to follow up on this.  _______________________________________________________  If you are age 94 or older, your body mass index should be between 23-30. Your Body mass index is 31.38 kg/m. If this is out of the aforementioned range listed, please consider follow up with your Primary Care Provider.  If you are age 45 or younger, your body mass index should be between 19-25. Your Body mass index is 31.38 kg/m. If this is out of the aformentioned range listed, please consider follow up with your Primary Care Provider.   ________________________________________________________  The [facility] providers would like to encourage you to use [facility] to communicate with providers for non-urgent requests or questions.  Due to long hold times on the telephone, sending your provider a message by [facility] may be a faster and more efficient way to get a response.  Please allow 48 business hours for a response.  Please remember that this is for non-urgent requests.  _______________________________________________________  Thank you for trusting me with your gastrointestinal care!   Boone Master, PA

## 2023-12-17 NOTE — Progress Notes (Signed)
 Chief Complaint: rectal discomfort Primary GI MD: Dr. Marina Goodell  HPI: Discussed the use of AI scribe software for clinical note transcription with the patient, who gave verbal consent to proceed.  History of Present Illness Spencer Jackson is a 43 year old male who presents with rectal bleeding and pain.  He has experienced rectal bleeding for several years, with a recent exacerbation prompting this visit. The bleeding primarily occurs in the morning and is not associated with every bowel movement. Pain accompanying the bleeding is a new symptom that has developed over the past few weeks. He initially thought the bleeding was due to 'dry skin or something.' Similar to when he was diagnosed with fissure in 2019.  He uses psyllium husk to aid with bowel movements, which he finds helpful and has prevented straining with bowel movements. He recalls being prescribed diltiazem during his first colonoscopy for similar symptoms and would like a refill.    PREVIOUS GI WORKUP   Colonoscopy 02/2023 - normal colonoscopy - repeat 10 years  Colonoscopy 2019 for rectal bleeding - three sessile polyps removed - posterior midline anal fissure - 5 year recall  Diagnosis 1. Surgical [P], ascending, polyp (2) - SESSILE SERRATED POLYP WITHOUT DYSPLASIA (TWO FRAGMENTS). - NO EVIDENCE OF MALIGNANCY. 2. Surgical [P], rectum, polyp - HYPERPLASTIC POLYP (ONE FRAGMENT). - NO ADENOMATOUS CHANGE OR MALIGNANCY.  Past Medical History:  Diagnosis Date   Allergy    Dizziness    GERD (gastroesophageal reflux disease)    Hypertension    no medications at this time 09-25-17   PAC (premature atrial contraction)    PVC's (premature ventricular contractions)     Past Surgical History:  Procedure Laterality Date   back cyst resection     COLONOSCOPY      Current Outpatient Medications  Medication Sig Dispense Refill   Cholecalciferol (VITAMIN D-3) 125 MCG (5000 UT) TABS Take by mouth.     No current  facility-administered medications for this visit.    Allergies as of 12/17/2023 - Review Complete 07/31/2023  Allergen Reaction Noted   Grass pollen(k-o-r-t-swt vern)  06/26/2022    Family History  Problem Relation Age of Onset   Hepatitis Mother        autoimmune   Colon cancer Neg Hx    Esophageal cancer Neg Hx    Rectal cancer Neg Hx    Stomach cancer Neg Hx    Colon polyps Neg Hx     Social History   Socioeconomic History   Marital status: Married    Spouse name: Not on file   Number of children: Not on file   Years of education: Not on file   Highest education level: Bachelor's degree (e.g., BA, AB, BS)  Occupational History   Not on file  Tobacco Use   Smoking status: Former    Current packs/day: 0.00    Types: Cigarettes    Quit date: 11/25/2005    Years since quitting: 18.0   Smokeless tobacco: Never  Vaping Use   Vaping status: Never Used  Substance and Sexual Activity   Alcohol use: Yes    Alcohol/week: 9.0 standard drinks of alcohol    Types: 9 Cans of beer per week    Comment: 4-5 x a month   Drug use: Not Currently    Types: Marijuana   Sexual activity: Not on file  Other Topics Concern   Not on file  Social History Narrative   Not on file   Social Drivers of  Health   Financial Resource Strain: Low Risk  (07/30/2023)   Overall Financial Resource Strain (CARDIA)    Difficulty of Paying Living Expenses: Not hard at all  Food Insecurity: No Food Insecurity (07/30/2023)   Hunger Vital Sign    Worried About Running Out of Food in the Last Year: Never true    Ran Out of Food in the Last Year: Never true  Transportation Needs: No Transportation Needs (07/30/2023)   PRAPARE - Administrator, Civil Service (Medical): No    Lack of Transportation (Non-Medical): No  Physical Activity: Sufficiently Active (07/30/2023)   Exercise Vital Sign    Days of Exercise per Week: 4 days    Minutes of Exercise per Session: 90 min  Stress: Stress  Concern Present (07/30/2023)   [employer] of Occupational Health - Occupational Stress Questionnaire    Feeling of Stress : To some extent  Social Connections: Socially Isolated (07/30/2023)   Social Connection and Isolation Panel [NHANES]    Frequency of Communication with Friends and Family: Never    Frequency of Social Gatherings with Friends and Family: Never    Attends Religious Services: Never    Diplomatic Services operational officer: No    Attends Engineer, structural: Not on file    Marital Status: Married  Catering manager Violence: Not on file    Review of Systems:    Constitutional: No weight loss, fever, chills, weakness or fatigue HEENT: Eyes: No change in vision               Ears, Nose, Throat:  No change in hearing or congestion Skin: No rash or itching Cardiovascular: No chest pain, chest pressure or palpitations   Respiratory: No SOB or cough Gastrointestinal: See HPI and otherwise negative Genitourinary: No dysuria or change in urinary frequency Neurological: No headache, dizziness or syncope Musculoskeletal: No new muscle or joint pain Hematologic: No bleeding or bruising Psychiatric: No history of depression or anxiety    Physical Exam:  Vital signs: Ht 5\' 11"  (1.803 m)   Wt 225 lb (102.1 kg)   BMI 31.38 kg/m   Constitutional: NAD, Well developed, Well nourished, alert and cooperative Head:  Normocephalic and atraumatic. Eyes:   PEERL, EOMI. No icterus. Conjunctiva pink. Respiratory: Respirations even and unlabored. Lungs clear to auscultation bilaterally.   No wheezes, crackles, or rhonchi.  Cardiovascular:  Regular rate and rhythm. No peripheral edema, cyanosis or pallor.  Gastrointestinal:  Soft, nondistended, nontender. No rebound or guarding. Normal bowel sounds. No appreciable masses or hepatomegaly. Rectal:  Not performed. Patient declined rectal exam. Msk:  Symmetrical without gross deformities. Without edema, no deformity or  joint abnormality.  Neurologic:  Alert and  oriented x4;  grossly normal neurologically.  Skin:   Dry and intact without significant lesions or rashes. Psychiatric: Oriented to person, place and time. Demonstrates good judgement and reason without abnormal affect or behaviors.  RELEVANT LABS AND IMAGING: CBC    Component Value Date/Time   WBC 5.9 07/31/2023 0851   WBC 5.1 07/11/2022 0824   RBC 5.18 07/31/2023 0851   RBC 4.79 07/11/2022 0824   HGB 15.5 07/31/2023 0851   HCT 48.7 07/31/2023 0851   PLT 282 07/31/2023 0851   MCV 94 07/31/2023 0851   MCH 29.9 07/31/2023 0851   MCH 31.1 07/11/2022 0824   MCHC 31.8 07/31/2023 0851   MCHC 34.4 07/11/2022 0824   RDW 12.2 07/31/2023 0851   LYMPHSABS 1.8 07/31/2023 0851  EOSABS 0.2 07/31/2023 0851   BASOSABS 0.0 07/31/2023 0851    CMP     Component Value Date/Time   NA 139 07/31/2023 0851   K 4.6 07/31/2023 0851   CL 102 07/31/2023 0851   CO2 23 07/31/2023 0851   GLUCOSE 93 07/31/2023 0851   GLUCOSE 94 07/11/2022 0824   BUN 16 07/31/2023 0851   CREATININE 0.97 07/31/2023 0851   CREATININE 0.89 07/11/2022 0824   CALCIUM 9.0 07/31/2023 0851   PROT 7.3 07/31/2023 0851   ALBUMIN 4.6 07/31/2023 0851   AST 29 07/31/2023 0851   ALT 21 07/31/2023 0851   ALKPHOS 64 07/31/2023 0851   BILITOT 0.4 07/31/2023 0851   GFRNONAA 99 08/16/2020 0932   GFRAA 115 08/16/2020 0932     Assessment/Plan:   Assessment & Plan  Rectal bleeding Rectal pain 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 No internal hemorrhoids noted in last colonoscopy. Psyllium husk beneficial. Previously improved with diltiazem 2% gel. 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 Declined rectal exam. -- Prescribe compound cream with diltiazem 2%/ lidocaine 5% for 6-8 weeks -- Advise use of a squatty potty. -- Consider referral to [facility] if symptoms persist. -- Please  call if worsening symptoms    Maddax Palinkas Jolee Ewing [facility] 12/17/2023, 8:30 AM  Cc: Everrett Coombe, DO

## 2024-03-29 IMAGING — US US SCROTUM W/ DOPPLER COMPLETE
1 series · 14 of 25 positions shown · non-contrast
Comparison: None.

CLINICAL DATA: Left-sided testicular pain

EXAM:
SCROTAL ULTRASOUND
DOPPLER ULTRASOUND OF THE TESTICLES
TECHNIQUE: Complete ultrasound examination of the testicles, epididymis, and
other scrotal structures was performed. Color and spectral Doppler
ultrasound were also utilized to evaluate blood flow to the
testicles.

[Series 1: us scrotum w/ doppler complete · 0.07mm/px · 14 of 50 slices shown]
[im 1/50]
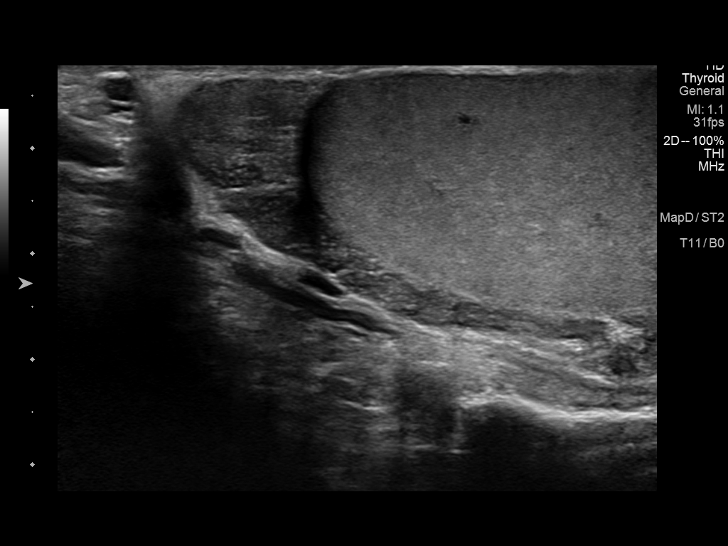
[im 5/50]
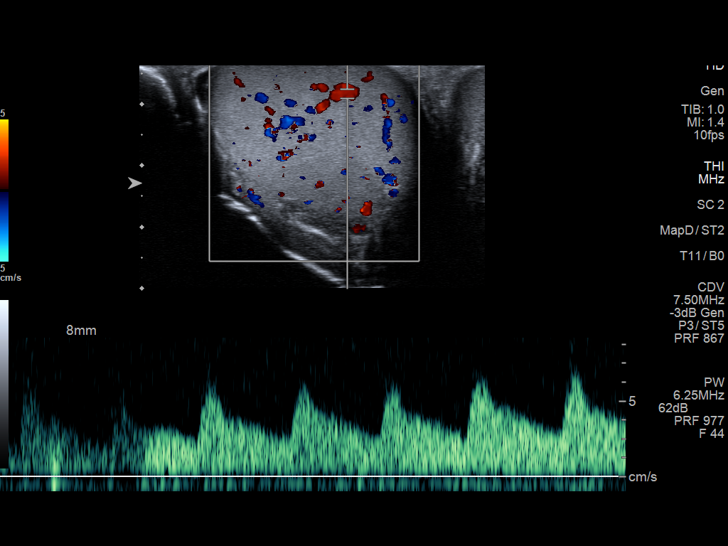
[im 9/50]
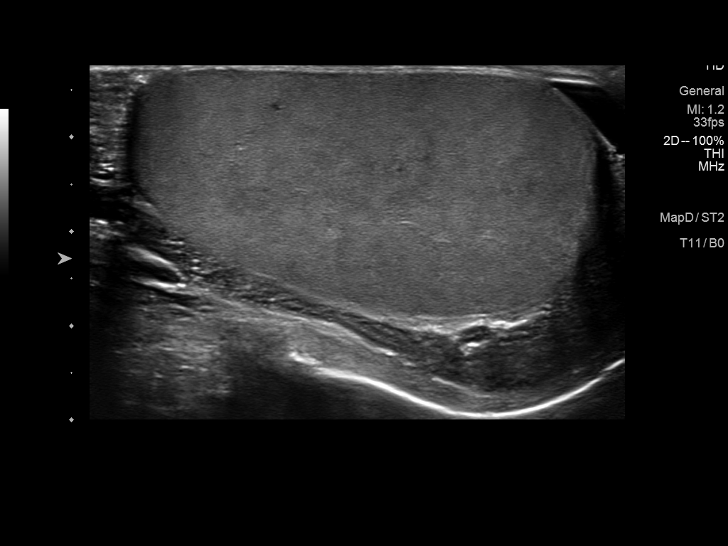
[im 13/50]
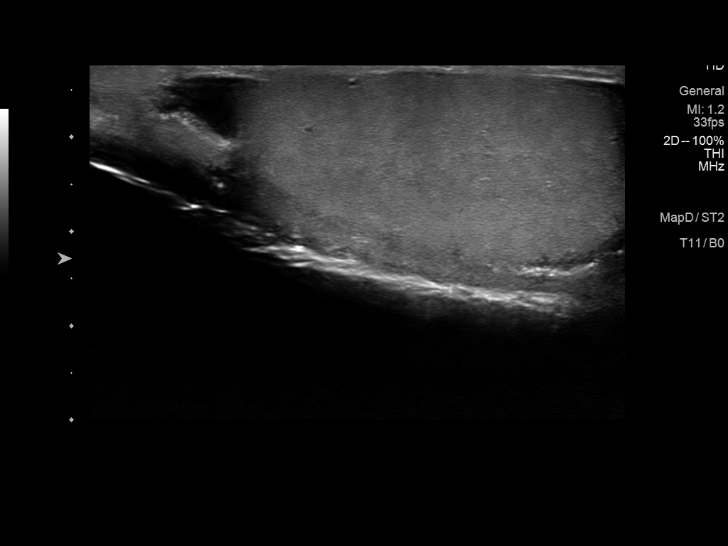
[im 17/50]
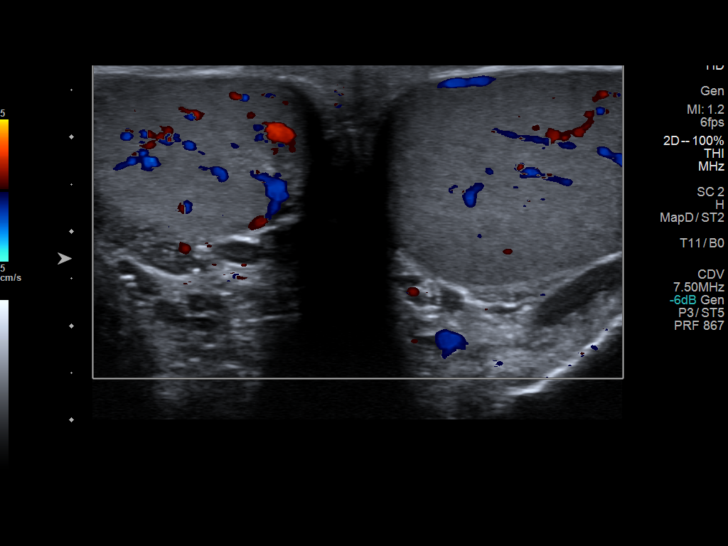
[im 19/50]
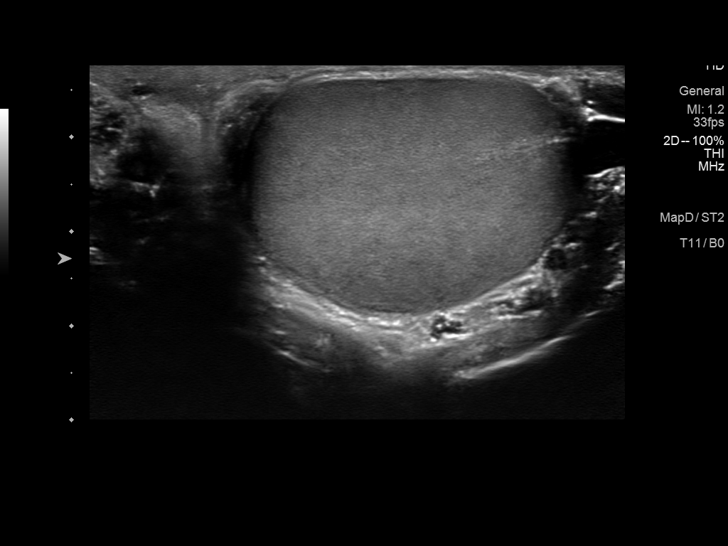
[im 23/50]
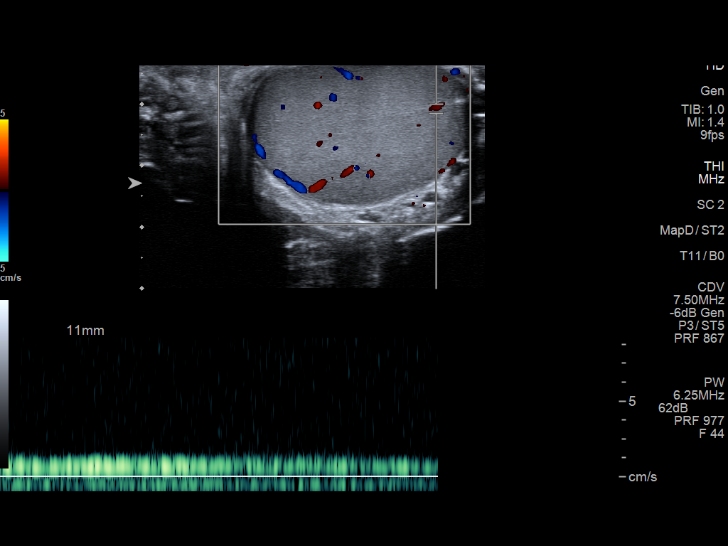
[im 27/50]
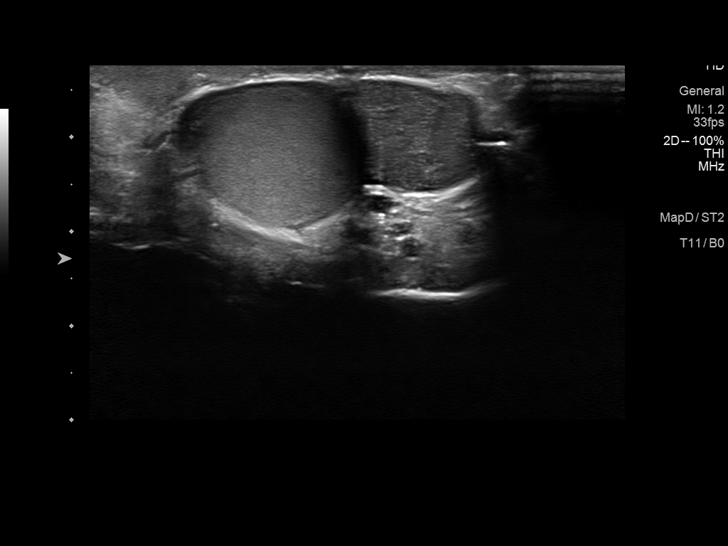
[im 31/50]
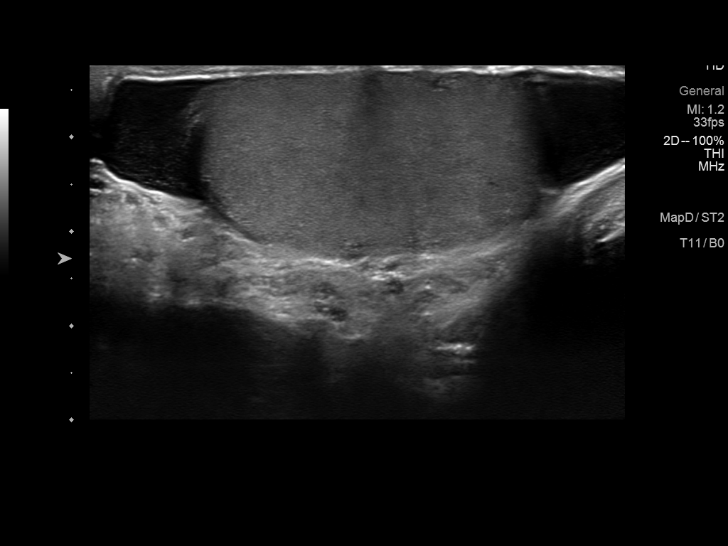
[im 33/50]
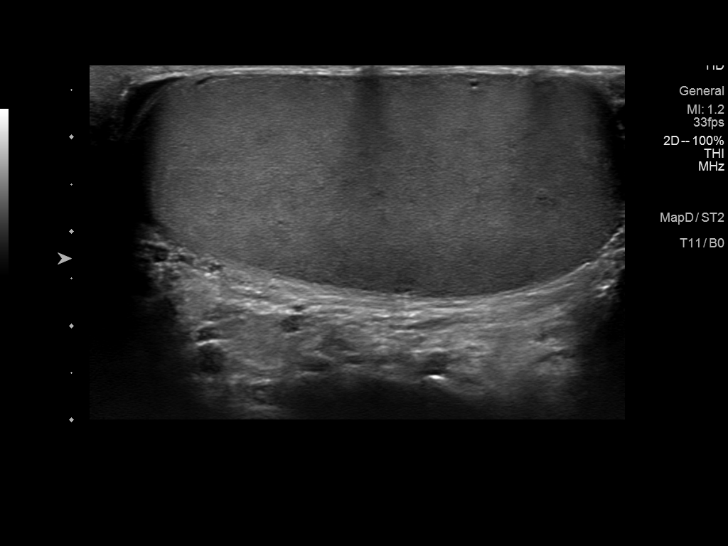
[im 37/50]
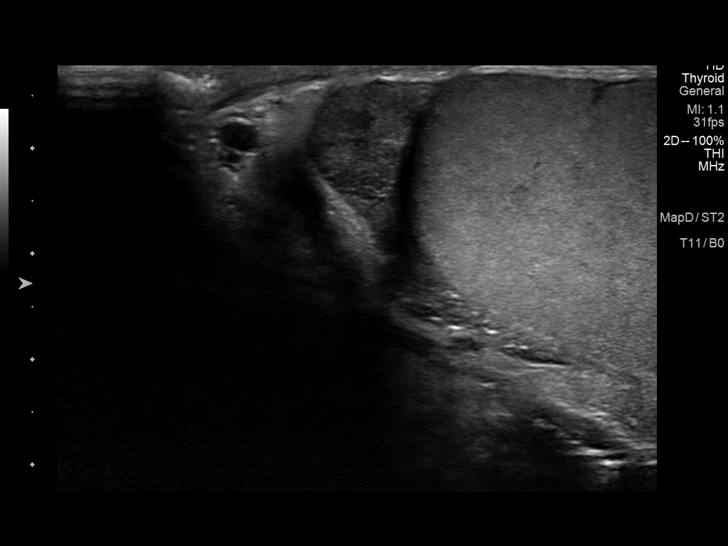
[im 41/50]
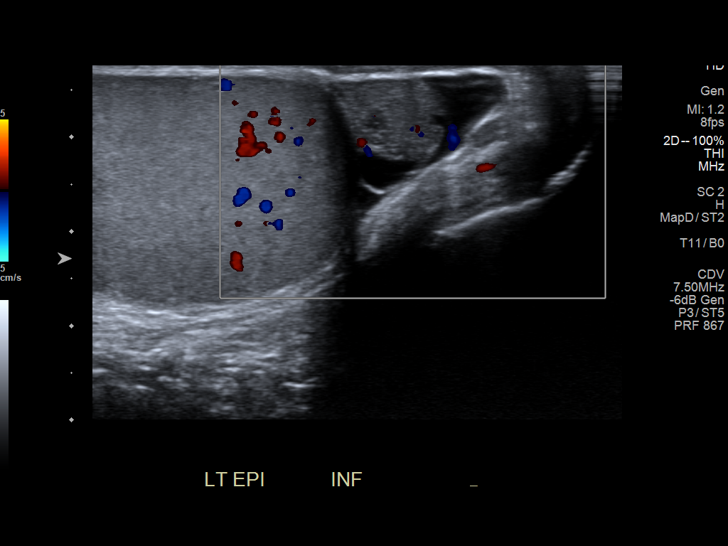
[im 45/50]
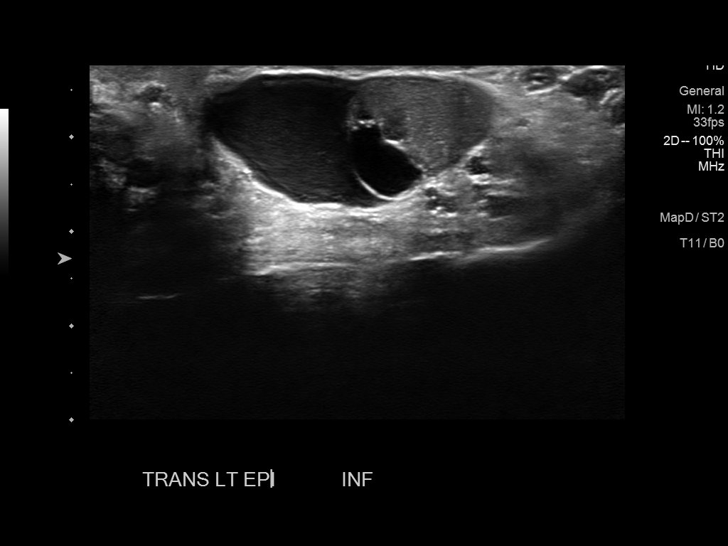
[im 50/50]
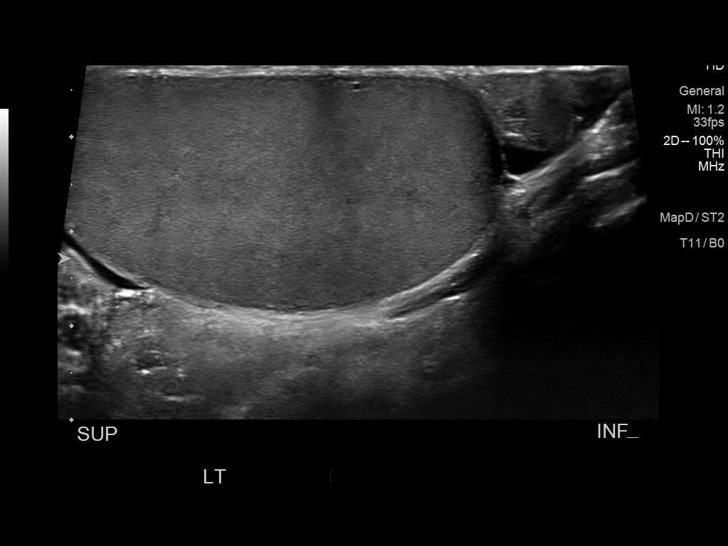

[14 of 25 positions shown; findings below may reference images not displayed]

FINDINGS: Right testicle

Measurements: 5.0 x 2.6 x 3.7 cm. No mass or microlithiasis
visualized.

Left testicle

Measurements: 5.0 x 2.4 x 3.4 cm. No mass or microlithiasis
visualized.

Right epididymis:  Normal in size and appearance.

Left epididymis:  6 mm epididymal cyst is noted on the left.

Hydrocele:  Small left hydrocele is noted.

Varicocele:  None visualized.

Pulsed Doppler interrogation of both testes demonstrates normal low
resistance arterial and venous waveforms bilaterally.
IMPRESSION: Small left epididymal cyst and hydrocele.

Normal-appearing testicles.

## 2024-05-29 ENCOUNTER — Ambulatory Visit (INDEPENDENT_AMBULATORY_CARE_PROVIDER_SITE_OTHER): Admitting: Family Medicine

## 2024-05-29 ENCOUNTER — Ambulatory Visit (INDEPENDENT_AMBULATORY_CARE_PROVIDER_SITE_OTHER)

## 2024-05-29 VITALS — BP 126/81 | HR 46 | Ht 71.0 in | Wt 225.0 lb

## 2024-05-29 DIAGNOSIS — R109 Unspecified abdominal pain: Secondary | ICD-10-CM

## 2024-05-29 DIAGNOSIS — R0789 Other chest pain: Secondary | ICD-10-CM | POA: Diagnosis not present

## 2024-05-29 DIAGNOSIS — Z23 Encounter for immunization: Secondary | ICD-10-CM

## 2024-05-29 DIAGNOSIS — R1011 Right upper quadrant pain: Secondary | ICD-10-CM

## 2024-05-29 MED ORDER — LISINOPRIL 10 MG PO TABS: 10.0000 mg | ORAL_TABLET | Freq: Every day | ORAL | 3 refills | Status: AC

## 2024-05-29 NOTE — Progress Notes (Signed)
 Spencer Jackson - 43 y.o. male MRN [id number]  Date of birth: 1981-07-22  Subjective Chief Complaint  Patient presents with   RUQ Pain    HPI Spencer Jackson is a 43 y.o. male here today with complaint of R sided abdominal pain.  He has had this for about 1.5 weeks..  Pain is more of a dull pressure.  Denies sharp pain.  No association with eating.  He does feel that his bowel movements are little firm at times.  He denies nausea or vomiting.  He had similar episode in 2022 with normal right upper quadrant ultrasound.  He has not had any fever or chills with current episode.  ROS:  A comprehensive ROS was completed and negative except as noted per HPI  Allergies  Allergen Reactions   Grass Pollen(K-O-R-T-Swt Vern)     Other reaction(s): eye redness    Past Medical History:  Diagnosis Date   Allergy    Dizziness    GERD (gastroesophageal reflux disease)    Hypertension    no medications at this time 09-25-17   PAC (premature atrial contraction)    PVC's (premature ventricular contractions)     Past Surgical History:  Procedure Laterality Date   back cyst resection     COLONOSCOPY      Social History   Socioeconomic History   Marital status: Married    Spouse name: Not on file   Number of children: Not on file   Years of education: Not on file   Highest education level: Bachelor's degree (e.g., BA, AB, BS)  Occupational History   Not on file  Tobacco Use   Smoking status: Former    Current packs/day: 0.00    Types: Cigarettes    Quit date: 11/25/2005    Years since quitting: 18.5   Smokeless tobacco: Never  Vaping Use   Vaping status: Never Used  Substance and Sexual Activity   Alcohol use: Yes    Alcohol/week: 9.0 standard drinks of alcohol    Types: 9 Cans of beer per week    Comment: 4-5 x a month   Drug use: Not Currently    Types: Marijuana   Sexual activity: Not on file  Other Topics Concern   Not on file  Social History Narrative   Not on file   Social  Drivers of Health   Financial Resource Strain: Low Risk  (05/29/2024)   Overall Financial Resource Strain (CARDIA)    Difficulty of Paying Living Expenses: Not hard at all  Food Insecurity: No Food Insecurity (05/29/2024)   Hunger Vital Sign    Worried About Running Out of Food in the Last Year: Never true    Ran Out of Food in the Last Year: Never true  Transportation Needs: No Transportation Needs (05/29/2024)   PRAPARE - Administrator, Civil Service (Medical): No    Lack of Transportation (Non-Medical): No  Physical Activity: Sufficiently Active (05/29/2024)   Exercise Vital Sign    Days of Exercise per Week: 6 days    Minutes of Exercise per Session: 60 min  Stress: Stress Concern Present (05/29/2024)   [employer] of Occupational Health - Occupational Stress Questionnaire    Feeling of Stress: Rather much  Social Connections: Moderately Isolated (05/29/2024)   Social Connection and Isolation Panel    Frequency of Communication with Friends and Family: More than three times a week    Frequency of Social Gatherings with Friends and Family: Never    Attends  Religious Services: Never    Active Member of Clubs or Organizations: No    Attends Engineer, structural: Not on file    Marital Status: Married    Family History  Problem Relation Age of Onset   Hepatitis Mother        autoimmune   Colon cancer Neg Hx    Esophageal cancer Neg Hx    Rectal cancer Neg Hx    Stomach cancer Neg Hx    Colon polyps Neg Hx     Health Maintenance  Topic Date Due   Hepatitis B Vaccines 19-59 Average Risk (1 of 3 - 19+ 3-dose series) Never done   HPV VACCINES (1 - 3-dose SCDM series) Never done   DTaP/Tdap/Td (2 - Td or Tdap) 05/30/2026   Influenza Vaccine  Completed   COVID-19 Vaccine  Completed   Hepatitis C Screening  Completed   HIV Screening  Completed   Pneumococcal Vaccine  Aged Out   Meningococcal B Vaccine  Aged Out   Colonoscopy  Discontinued      ----------------------------------------------------------------------------------------------------------------------------------------------------------------------------------------------------------------- Physical Exam BP 126/81 (BP Location: Left Arm, Patient Position: Sitting, Cuff Size: Large)   Pulse (!) 46   Ht 5' 11 (1.803 m)   Wt 225 lb (102.1 kg)   SpO2 100%   BMI 31.38 kg/m   Physical Exam Constitutional:      Appearance: Normal appearance.  Eyes:     General: No scleral icterus. Cardiovascular:     Rate and Rhythm: Normal rate and regular rhythm.  Pulmonary:     Effort: Pulmonary effort is normal.     Breath sounds: Normal breath sounds.  Abdominal:     Comments: Tenderness to palpation along the right lower ribs along the costochondral junction.  Musculoskeletal:     Cervical back: Neck supple.  Neurological:     General: No focal deficit present.     Mental Status: He is alert.  Psychiatric:        Mood and Affect: Mood normal.        Behavior: Behavior normal.     ------------------------------------------------------------------------------------------------------------------------------------------------------------------------------------------------------------------- Assessment and Plan  RUQ discomfort Discomfort seems to be more along the costochondral junction.  Checking vitamin D  levels as well as CMP and CBC today.  His ultrasound in 2022 was relatively unremarkable.  He may have excess stool burden as he does have some hard stools.  Will obtain KUB.   Meds ordered this encounter  Medications   lisinopril  (ZESTRIL ) 10 MG tablet    Sig: Take 1 tablet (10 mg total) by mouth daily.    Dispense:  90 tablet    Refill:  3    No follow-ups on file.

## 2024-05-29 NOTE — Assessment & Plan Note (Addendum)
 Discomfort seems to be more along the costochondral junction.  Checking vitamin D  levels as well as CMP and CBC today.  His ultrasound in 2022 was relatively unremarkable.  He may have excess stool burden as he does have some hard stools.  Will obtain KUB.

## 2024-05-30 LAB — CMP14+EGFR
ALT: 17 IU/L (ref 0–44)
AST: 27 IU/L (ref 0–40)
Albumin: 4.4 g/dL (ref 4.1–5.1)
Alkaline Phosphatase: 59 IU/L (ref 47–123)
BUN/Creatinine Ratio: 16 (ref 9–20)
BUN: 16 mg/dL (ref 6–24)
Bilirubin Total: 0.4 mg/dL (ref 0.0–1.2)
CO2: 22 mmol/L (ref 20–29)
Calcium: 8.9 mg/dL (ref 8.7–10.2)
Chloride: 102 mmol/L (ref 96–106)
Creatinine, Ser: 0.97 mg/dL (ref 0.76–1.27)
Globulin, Total: 2.4 g/dL (ref 1.5–4.5)
Glucose: 85 mg/dL (ref 70–99)
Potassium: 4.4 mmol/L (ref 3.5–5.2)
Sodium: 140 mmol/L (ref 134–144)
Total Protein: 6.8 g/dL (ref 6.0–8.5)
eGFR: 100 mL/min/1.73 (ref >59)

## 2024-05-30 LAB — CBC WITH DIFFERENTIAL/PLATELET
Basophils Absolute: 0 x10E3/uL (ref 0.0–0.2)
Basos: 0 %
EOS (ABSOLUTE): 0.2 x10E3/uL (ref 0.0–0.4)
Eos: 3 %
Hematocrit: 47.4 % (ref 37.5–51.0)
Hemoglobin: 15.5 g/dL (ref 13.0–17.7)
Immature Grans (Abs): 0 x10E3/uL (ref 0.0–0.1)
Immature Granulocytes: 0 %
Lymphocytes Absolute: 1.6 x10E3/uL (ref 0.7–3.1)
Lymphs: 21 %
MCH: 31.1 pg (ref 26.6–33.0)
MCHC: 32.7 g/dL (ref 31.5–35.7)
MCV: 95 fL (ref 79–97)
Monocytes Absolute: 0.6 x10E3/uL (ref 0.1–0.9)
Monocytes: 8 %
Neutrophils Absolute: 5 x10E3/uL (ref 1.4–7.0)
Neutrophils: 68 %
Platelets: 265 x10E3/uL (ref 150–450)
RBC: 4.98 x10E6/uL (ref 4.14–5.80)
RDW: 12.7 % (ref 11.6–15.4)
WBC: 7.3 x10E3/uL (ref 3.4–10.8)

## 2024-05-30 LAB — VITAMIN D 25 HYDROXY (VIT D DEFICIENCY, FRACTURES): Vit D, 25-Hydroxy: 28.9 ng/mL — ABNORMAL LOW (ref 30.0–100.0)

## 2024-06-04 ENCOUNTER — Ambulatory Visit: Payer: Self-pay | Admitting: Family Medicine

## 2024-07-01 ENCOUNTER — Encounter: Payer: Self-pay | Admitting: Family Medicine

## 2024-07-01 ENCOUNTER — Ambulatory Visit (INDEPENDENT_AMBULATORY_CARE_PROVIDER_SITE_OTHER): Admitting: Family Medicine

## 2024-07-01 VITALS — BP 121/80 | Ht 71.0 in | Wt 223.0 lb

## 2024-07-01 DIAGNOSIS — H9312 Tinnitus, left ear: Secondary | ICD-10-CM | POA: Diagnosis not present

## 2024-07-01 DIAGNOSIS — H9319 Tinnitus, unspecified ear: Secondary | ICD-10-CM | POA: Insufficient documentation

## 2024-07-01 NOTE — Patient Instructions (Signed)
   You can try this or generic of this.

## 2024-07-01 NOTE — Progress Notes (Signed)
 Spencer Jackson - 43 y.o. male MRN [id number]  Date of birth: [birth year]  Subjective Chief Complaint  Patient presents with   Tinnitus    HPI Spencer Jackson is a 43 y.o. male here today with complaint of ongoing tinnitus.  He has had this for a little ove ra year in his L ear.  Seen by ENT previously and had hearing test which did show some mild hearing loss. He denies headache, dizziness or vision changes.   ROS:  A comprehensive ROS was completed and negative except as noted per HPI   Allergies  Allergen Reactions   Grass Pollen(K-O-R-T-Swt Vern)     Other reaction(s): eye redness    Past Medical History:  Diagnosis Date   Allergy    Dizziness    GERD (gastroesophageal reflux disease)    Hypertension    no medications at this time 09-25-17   PAC (premature atrial contraction)    PVC's (premature ventricular contractions)     Past Surgical History:  Procedure Laterality Date   back cyst resection     COLONOSCOPY      Social History   Socioeconomic History   Marital status: Married    Spouse name: Not on file   Number of children: Not on file   Years of education: Not on file   Highest education level: Bachelor's degree (e.g., BA, AB, BS)  Occupational History   Not on file  Tobacco Use   Smoking status: Former    Current packs/day: 0.00    Types: Cigarettes    Quit date: 11/25/2005    Years since quitting: 18.6   Smokeless tobacco: Never  Vaping Use   Vaping status: Never Used  Substance and Sexual Activity   Alcohol use: Yes    Alcohol/week: 9.0 standard drinks of alcohol    Types: 9 Cans of beer per week    Comment: 4-5 x a month   Drug use: Not Currently    Types: Marijuana   Sexual activity: Not on file  Other Topics Concern   Not on file  Social History Narrative   Not on file   Social Drivers of Health   Financial Resource Strain: Low Risk  (05/29/2024)   Overall Financial Resource Strain (CARDIA)    Difficulty of Paying Living Expenses: Not hard  at all  Food Insecurity: No Food Insecurity (05/29/2024)   Hunger Vital Sign    Worried About Running Out of Food in the Last Year: Never true    Ran Out of Food in the Last Year: Never true  Transportation Needs: No Transportation Needs (05/29/2024)   PRAPARE - Administrator, Civil Service (Medical): No    Lack of Transportation (Non-Medical): No  Physical Activity: Sufficiently Active (05/29/2024)   Exercise Vital Sign    Days of Exercise per Week: 6 days    Minutes of Exercise per Session: 60 min  Stress: Stress Concern Present (05/29/2024)   [employer] of Occupational Health - Occupational Stress Questionnaire    Feeling of Stress: Rather much  Social Connections: Moderately Isolated (05/29/2024)   Social Connection and Isolation Panel    Frequency of Communication with Friends and Family: More than three times a week    Frequency of Social Gatherings with Friends and Family: Never    Attends Religious Services: Never    Database administrator or Organizations: No    Attends Engineer, structural: Not on file    Marital Status: Married  Family History  Problem Relation Age of Onset   Hepatitis Mother        autoimmune   Colon cancer Neg Hx    Esophageal cancer Neg Hx    Rectal cancer Neg Hx    Stomach cancer Neg Hx    Colon polyps Neg Hx     Health Maintenance  Topic Date Due   Hepatitis B Vaccines 19-59 Average Risk (1 of 3 - 19+ 3-dose series) Never done   HPV VACCINES (1 - 3-dose SCDM series) Never done   COVID-19 Vaccine (5 - 2025-26 season) 07/17/2025 (Originally 05/12/2024)   DTaP/Tdap/Td (2 - Td or Tdap) 05/30/2026   Influenza Vaccine  Completed   Hepatitis C Screening  Completed   HIV Screening  Completed   Pneumococcal Vaccine  Aged Out   Meningococcal B Vaccine  Aged Out   Colonoscopy  Discontinued      ----------------------------------------------------------------------------------------------------------------------------------------------------------------------------------------------------------------- Physical Exam BP 121/80 (BP Location: Left Arm, Patient Position: Sitting, Cuff Size: Large)   Ht 5' 11 (1.803 m)   Wt 223 lb (101.2 kg)   BMI 31.10 kg/m   Physical Exam Constitutional:      Appearance: Normal appearance.  HENT:     Head: Normocephalic and atraumatic.     Right Ear: Tympanic membrane and ear canal normal.     Left Ear: Tympanic membrane and ear canal normal.  Neurological:     General: No focal deficit present.     Mental Status: He is alert.     Cranial Nerves: No cranial nerve deficit.     Motor: No weakness.  Psychiatric:        Mood and Affect: Mood normal.        Behavior: Behavior normal.     ------------------------------------------------------------------------------------------------------------------------------------------------------------------------------------------------------------------- Assessment and Plan  Tinnitus Can do a trial of lipo flavonoid or similar.  MRI brain w/wo contrast ordered to evaluate for vascular or neoplastic cause.     No orders of the defined types were placed in this encounter.   No follow-ups on file.

## 2024-07-01 NOTE — Assessment & Plan Note (Signed)
 Can do a trial of lipo flavonoid or similar.  MRI brain w/wo contrast ordered to evaluate for vascular or neoplastic cause.

## 2024-07-14 ENCOUNTER — Ambulatory Visit

## 2024-07-14 DIAGNOSIS — H9312 Tinnitus, left ear: Secondary | ICD-10-CM | POA: Diagnosis not present

## 2024-07-14 MED ORDER — GADOBUTROL 1 MMOL/ML IV SOLN
10.0000 mL | Freq: Once | INTRAVENOUS | Status: AC | PRN
  Administered 2024-07-14: 10 mL via INTRAVENOUS

## 2024-07-18 ENCOUNTER — Ambulatory Visit: Payer: Self-pay | Admitting: Family Medicine

## 2024-07-31 ENCOUNTER — Encounter: Admitting: Family Medicine

## 2024-08-13 ENCOUNTER — Encounter: Admitting: Family Medicine

## 2024-08-14 ENCOUNTER — Ambulatory Visit: Attending: Internal Medicine | Admitting: Internal Medicine

## 2024-08-14 ENCOUNTER — Ambulatory Visit

## 2024-08-14 ENCOUNTER — Encounter: Payer: Self-pay | Admitting: Internal Medicine

## 2024-08-14 VITALS — BP 114/78 | HR 46 | Ht 71.0 in | Wt 222.0 lb

## 2024-08-14 DIAGNOSIS — I493 Ventricular premature depolarization: Secondary | ICD-10-CM | POA: Diagnosis not present

## 2024-08-14 DIAGNOSIS — I491 Atrial premature depolarization: Secondary | ICD-10-CM

## 2024-08-14 DIAGNOSIS — R002 Palpitations: Secondary | ICD-10-CM

## 2024-08-14 DIAGNOSIS — I10 Essential (primary) hypertension: Secondary | ICD-10-CM | POA: Diagnosis not present

## 2024-08-14 NOTE — Progress Notes (Signed)
 Cardiology Office Note   Date:  08/14/2024  ID:  Mcclain Shall, DOB 1980-11-05, MRN [id number] PCP: Alvia Bring, DO  [facility] Providers Cardiologist:  Emeline FORBES Calender, DO     History of Present Illness Spencer Jackson is a 43 y.o. male with a history of hypertension, PACs, PVCs and palpitations, mild MR who presents today to discuss palpitations.  He said that this weekend he did not sleep well as he was up late playing videogames on Saturday.  For the entire day on Sunday he was having frequent palpitations which was concerning to him and caused him to have poor sleep at night as well.  This prompted him to come to the office today.  He does drink caffeine but did not specify how much.  He has not had any syncopal episodes but years ago did have presyncopal episodes associated with palpitations.  He had a Zio patch in the past which showed PACs and PVCs.  He has not been on beta-blockade, presumably due to bradycardia at baseline.  He notes that the symptoms increase occasionally after meals or energy drinks which he has stopped drinking for this reason.  Currently he denies any chest pain or any other issues.  He exercises regularly and does not get symptoms.  Has not had any alcohol in the past 10 weeks.     ROS:  Review of Systems  All other systems reviewed and are negative.   Physical Exam  Physical Exam Vitals and nursing note reviewed.  Constitutional:      Appearance: Normal appearance.  HENT:     Head: Normocephalic and atraumatic.  Eyes:     Conjunctiva/sclera: Conjunctivae normal.  Cardiovascular:     Rate and Rhythm: Normal rate and regular rhythm.  Pulmonary:     Effort: Pulmonary effort is normal.     Breath sounds: Normal breath sounds.  Musculoskeletal:        General: No swelling or tenderness.  Skin:    Coloration: Skin is not jaundiced or pale.  Neurological:     Mental Status: He is alert.     VS:  BP 114/78   Pulse (!) 46   Ht 5' 11 (1.803  m)   Wt 222 lb (100.7 kg)   SpO2 97%   BMI 30.96 kg/m         Wt Readings from Last 3 Encounters:  08/14/24 222 lb (100.7 kg)  07/01/24 223 lb (101.2 kg)  05/29/24 225 lb (102.1 kg)     EKG Interpretation Date/Time:  Thursday August 14 2024 08:23:24 EST Ventricular Rate:  46 PR Interval:  196 QRS Duration:  86 QT Interval:  446 QTC Calculation: 390 R Axis:   60  Text Interpretation: Sinus bradycardia No previous ECGs available Confirmed by Calender Emeline [phone]) on 08/14/2024 8:31:24 AM    Studies Reviewed   Echo from 08/10/2020:   1. Left ventricular ejection fraction, by estimation, is 55 to 60%. The  left ventricle has normal function. The left ventricle has no regional  wall motion abnormalities. Left ventricular diastolic parameters were  normal. The average left ventricular  global longitudinal strain is -23.3 %. The global longitudinal strain is  normal.   2. Right ventricular systolic function is normal. The right ventricular  size is normal.   3. The mitral valve is normal in structure. Trivial mitral valve  regurgitation. No evidence of mitral stenosis.   4. The aortic valve is normal in structure. Aortic valve regurgitation is  not visualized. No aortic stenosis is present.   5. The inferior vena cava is normal in size with greater than 50%  respiratory variability, suggesting right atrial pressure of 3 mmHg.   Comparison(s): 12/15/15 EF 60-65%. GLS -22.6%.   ETT 08/10/2020: Blood pressure demonstrated a hypertensive response to exercise. There was no ST segment deviation noted during stress. No T wave inversion was noted during stress. Excellent exercise capacity. Negative, adequate stress test.    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  No known history of sleep apnea.  Symptoms are likely due to poor sleep leading to increased PVCs.  Has not had a recent workup. Hypertension well-controlled on lisinopril . Asymptomatic sinus bradycardia   Plan  3-day Zio patch.  Given his bradycardia I would be concerned about starting on AV nodal blocking agents unless his symptoms are debilitating or he has a high PVC burden and cardiomyopathy but in that case would refer to electrophysiology. Echocardiogram We reviewed red flag signs/symptoms of PVCs/palpitations which he is currently not experiencing but will contact us  or go to the ED if he does experience those symptoms Cardiac risk counseling and prevention recommendations: Heart healthy/Mediterranean diet with whole grains, fruits, vegetable, fish, lean meats, nuts, and olive oil. Limit salt. Moderate walking, 3-5 times/week for 30-50 minutes each session. Aim for at least 150 minutes.week. Goal should be pace of 3 miles/hour, or walking 1.5 miles in 30 minutes Avoidance of tobacco products. Avoid excess alcohol.  Follow up: Pending above workup          Signed, Emeline FORBES Calender, DO

## 2024-08-14 NOTE — Patient Instructions (Signed)
 Medication Instructions:  No medication changes were made at this visit. Continue current regimen.  *If you need a refill on your cardiac medications before your next appointment, please call your pharmacy*  Testing/Procedures: Your physician has requested that you have an echocardiogram. Echocardiography is a painless test that uses sound waves to create images of your heart. It provides your doctor with information about the size and shape of your heart and how well your heart's chambers and valves are working. This procedure takes approximately one hour. There are no restrictions for this procedure. Please do NOT wear cologne, perfume, aftershave, or lotions (deodorant is allowed). Please arrive 15 minutes prior to your appointment time.  Please note: We ask at that you not bring children with you during ultrasound (echo/ vascular) testing. Due to room size and safety concerns, children are not allowed in the ultrasound rooms during exams. Our front office staff cannot provide observation of children in our lobby area while testing is being conducted. An adult accompanying a patient to their appointment will only be allowed in the ultrasound room at the discretion of the ultrasound technician under special circumstances. We apologize for any inconvenience.   Your physician has requested that you wear a Zio heart monitor for 3 days. This will be mailed to your home with instructions on how to apply the monitor and how to return it when finished. Please allow 2 weeks after returning the heart monitor before our office calls you with the results.    Follow-Up: At [facility], you and your health needs are our priority.  As part of our continuing mission to provide you with exceptional heart care, our providers are all part of one team.  This team includes your primary Cardiologist (physician) and Advanced Practice Providers or APPs (Physician Assistants and Nurse Practitioners) who all work  together to provide you with the care you need, when you need it.  Your next appointment:   As needed after tests  Provider:   Emeline FORBES Calender, DO    We recommend signing up for the patient portal called MyChart.  Sign up information is provided on this After Visit Summary.  MyChart is used to connect with patients for Virtual Visits (Telemedicine).  Patients are able to view lab/test results, encounter notes, upcoming appointments, etc.  Non-urgent messages can be sent to your provider as well.   To learn more about what you can do with MyChart, go to [email or web address].   Other Instructions ZIO XT- Long Term Monitor Instructions Your physician has requested you wear a ZIO patch monitor for 3 days.  This is a single patch monitor. [employer] supplies one patch monitor per enrollment. Additional stickers are not available. Please do not apply patch if you will be having a Nuclear Stress Test, Echocardiogram, Cardiac CT, MRI, or Chest Xray during the period you would be wearing the monitor. The patch cannot be worn during these tests. You cannot remove and re-apply the ZIO XT patch monitor.  Your ZIO patch monitor will be mailed 3 day USPS to your address on file. It may take 3-5 days to receive your monitor after you have been enrolled.  Once you have received your monitor, please review the enclosed instructions. Your monitor has already been registered assigning a specific monitor serial # to you.    Billing and Patient Assistance Program Information We have supplied [employer] with any of your insurance information on file for billing purposes. [employer] offers a sliding scale Patient Assistance Program  for patients that do not have insurance, or whose insurance does not completely cover the cost of the ZIO monitor. You must apply for the Patient Assistance Program to qualify for this discounted rate.  To apply, please call [employer] at [phone], select option 4, select option 2,  ask to apply for Patient Assistance Program. Meredeth will ask your household income, and how many people are in your household. They will quote your out-of-pocket cost based on that information. [employer] will also be able to set up a 78-month, interest-free payment plan if needed.    Applying the monitor  Shave hair from upper left chest.  Hold abrader disc by orange tab. Rub abrader in 40 strokes over the upper left chest as  indicated in your monitor instructions.  Clean area with 4 enclosed alcohol pads. Let dry.  Apply patch as indicated in monitor instructions. Patch will be placed under collarbone on left  side of chest with arrow pointing upward.  Rub patch adhesive wings for 2 minutes. Remove white label marked 1. Remove the white  label marked 2. Rub patch adhesive wings for 2 additional minutes.  While looking in a mirror, press and release button in center of patch. A small green light will  flash 3-4 times. This will be your only indicator that the monitor has been turned on.  Do not shower for the first 24 hours. You may shower after the first 24 hours.  Press the button if you feel a symptom. You will hear a small click. Record Date, Time and  Symptom in the Patient Logbook.  When you are ready to remove the patch, follow instructions on the last 2 pages of Patient Logbook. Stick patch monitor onto the last page of Patient Logbook.  Place Patient Logbook in the blue and white box. Use locking tab on box and tape box closed securely. The blue and white box has prepaid postage on it. Please place it in the mailbox as soon as possible. Your physician should have your test results approximately 7 days after the monitor has been mailed back to [facility].  Call [facility] Customer Care at [phone] if you have questions regarding your ZIO XT patch monitor. Call them immediately if you see an orange light blinking on your monitor.  If your monitor  falls off in less than 4 days, contact our Monitor department at [phone].  If your monitor becomes loose or falls off after 4 days call [employer] at [phone] for suggestions on securing your monitor.

## 2024-08-14 NOTE — Progress Notes (Unsigned)
 Enrolled patient for a 3 day Zio XT monitor to be mailed to patients home

## 2024-08-19 ENCOUNTER — Encounter: Admitting: Family Medicine

## 2024-08-28 ENCOUNTER — Encounter: Payer: Self-pay | Admitting: Family Medicine

## 2024-08-28 ENCOUNTER — Ambulatory Visit: Admitting: Family Medicine

## 2024-08-28 VITALS — BP 117/75 | HR 79 | Resp 16 | Ht 73.0 in | Wt 217.6 lb

## 2024-08-28 DIAGNOSIS — R6882 Decreased libido: Secondary | ICD-10-CM

## 2024-08-28 DIAGNOSIS — Z13220 Encounter for screening for lipoid disorders: Secondary | ICD-10-CM

## 2024-08-28 DIAGNOSIS — I493 Ventricular premature depolarization: Secondary | ICD-10-CM

## 2024-08-28 DIAGNOSIS — Z0000 Encounter for general adult medical examination without abnormal findings: Secondary | ICD-10-CM

## 2024-08-28 DIAGNOSIS — I10 Essential (primary) hypertension: Secondary | ICD-10-CM

## 2024-08-28 DIAGNOSIS — E559 Vitamin D deficiency, unspecified: Secondary | ICD-10-CM

## 2024-08-28 NOTE — Assessment & Plan Note (Signed)
 Well adult Orders Placed This Encounter  Procedures   CMP14+EGFR   CBC with Differential/Platelet   Lipid Panel With LDL/HDL Ratio   TSH   Vitamin D  (25 hydroxy)   Testosterone    Magnesium  Screening: Per lab orders Immunization: UTD Anticipatory guidance/Risk factor reduction:  Counseled on continuing to reduce EtOH intake, healthy diet and regular exercise. Additional recommendations per AVS.

## 2024-08-28 NOTE — Progress Notes (Signed)
 Spencer Jackson - 43 y.o. male MRN [id number]  Date of birth: 12/17/1980  Subjective Chief Complaint  Patient presents with   Annual Exam    HPI Spencer Jackson is a 43 y.o. male here today for annual exam.   He reports that he is doing pretty well.   He is seeing cardiology due to palpitations.  Recently completed Zio patch and has upcoming Echo.   He remains pretty active.  He feels that diet is pretty good.   He is a non-smoker.  Occasional EtOH use.   Review of Systems  Constitutional:  Negative for chills, fever, malaise/fatigue and weight loss.  HENT:  Negative for congestion, ear pain and sore throat.   Eyes:  Negative for blurred vision, double vision and pain.  Respiratory:  Negative for cough and shortness of breath.   Cardiovascular:  Negative for chest pain and palpitations.  Gastrointestinal:  Negative for abdominal pain, blood in stool, constipation, heartburn and nausea.  Genitourinary:  Negative for dysuria and urgency.  Musculoskeletal:  Negative for joint pain and myalgias.  Neurological:  Negative for dizziness and headaches.  Endo/Heme/Allergies:  Does not bruise/bleed easily.  Psychiatric/Behavioral:  Negative for depression. The patient is not nervous/anxious and does not have insomnia.     Allergies[1]  Past Medical History:  Diagnosis Date   Allergy    Dizziness    GERD (gastroesophageal reflux disease)    Hypertension    no medications at this time 09-25-17   PAC (premature atrial contraction)    PVC's (premature ventricular contractions)     Past Surgical History:  Procedure Laterality Date   back cyst resection     COLONOSCOPY      Social History   Socioeconomic History   Marital status: Married    Spouse name: Not on file   Number of children: Not on file   Years of education: Not on file   Highest education level: Bachelor's degree (e.g., BA, AB, BS)  Occupational History   Not on file  Tobacco Use   Smoking status: Former    Current  packs/day: 0.00    Types: Cigarettes    Quit date: 11/25/2005    Years since quitting: 18.7   Smokeless tobacco: Never  Vaping Use   Vaping status: Never Used  Substance and Sexual Activity   Alcohol use: Yes    Alcohol/week: 9.0 standard drinks of alcohol    Types: 9 Cans of beer per week    Comment: 4-5 x a month   Drug use: Not Currently    Types: Marijuana   Sexual activity: Not on file  Other Topics Concern   Not on file  Social History Narrative   Not on file   Social Drivers of Health   Tobacco Use: Medium Risk (08/28/2024)   Patient History    Smoking Tobacco Use: Former    Smokeless Tobacco Use: Never    Passive Exposure: Not on file  Financial Resource Strain: Low Risk (05/29/2024)   Overall Financial Resource Strain (CARDIA)    Difficulty of Paying Living Expenses: Not hard at all  Food Insecurity: No Food Insecurity (05/29/2024)   Epic    Worried About Programme Researcher, Broadcasting/film/video in the Last Year: Never true    Ran Out of Food in the Last Year: Never true  Transportation Needs: No Transportation Needs (05/29/2024)   Epic    Lack of Transportation (Medical): No    Lack of Transportation (Non-Medical): No  Physical Activity: Sufficiently Active (  05/29/2024)   Exercise Vital Sign    Days of Exercise per Week: 6 days    Minutes of Exercise per Session: 60 min  Stress: Stress Concern Present (05/29/2024)   [employer] of Occupational [employer] - Occupational Stress Questionnaire    Feeling of Stress: Rather much  Social Connections: Moderately Isolated (05/29/2024)   Social Connection and Isolation Panel    Frequency of Communication with Friends and Family: More than three times a week    Frequency of Social Gatherings with Friends and Family: Never    Attends Religious Services: Never    Database Administrator or Organizations: No    Attends Engineer, Structural: Not on file    Marital Status: Married  Depression (PHQ2-9): Medium Risk (08/28/2024)    Depression (PHQ2-9)    PHQ-2 Score: 9  Alcohol Screen: Medium Risk (05/29/2024)   Alcohol Screen    Last Alcohol Screening Score (AUDIT): 13  Housing: Low Risk (05/29/2024)   Epic    Unable to Pay for Housing in the Last Year: No    Number of Times Moved in the Last Year: 0    Homeless in the Last Year: No  Utilities: Not At Risk (07/01/2024)   Epic    Threatened with loss of utilities: No  Health Literacy: Inadequate Health Literacy (07/01/2024)   B1300 Health Literacy    Frequency of need for help with medical instructions: Sometimes    Family History  Problem Relation Age of Onset   Hepatitis Mother        autoimmune   Colon cancer Neg Hx    Esophageal cancer Neg Hx    Rectal cancer Neg Hx    Stomach cancer Neg Hx    Colon polyps Neg Hx     Health Maintenance  Topic Date Due   Hepatitis B Vaccines 19-59 Average Risk (1 of 3 - 19+ 3-dose series) Never done   HPV VACCINES (1 - 3-dose SCDM series) Never done   COVID-19 Vaccine (5 - 2025-26 season) 07/17/2025 (Originally 05/12/2024)   DTaP/Tdap/Td (2 - Td or Tdap) 05/30/2026   Influenza Vaccine  Completed   Hepatitis C Screening  Completed   HIV Screening  Completed   Pneumococcal Vaccine  Aged Out   Meningococcal B Vaccine  Aged Out   Colonoscopy  Discontinued     ----------------------------------------------------------------------------------------------------------------------------------------------------------------------------------------------------------------- Physical Exam BP 117/75 (BP Location: Left Arm, Patient Position: Sitting, Cuff Size: Normal)   Pulse 79   Resp 16   Ht 6' 1 (1.854 m)   Wt 217 lb 9.6 oz (98.7 kg)   SpO2 99%   BMI 28.71 kg/m   Physical Exam Constitutional:      General: He is not in acute distress. HENT:     Head: Normocephalic and atraumatic.     Right Ear: Tympanic membrane and external ear normal.     Left Ear: Tympanic membrane and external ear normal.  Eyes:      General: No scleral icterus. Neck:     Thyroid: No thyromegaly.  Cardiovascular:     Rate and Rhythm: Normal rate and regular rhythm.     Heart sounds: Normal heart sounds.  Pulmonary:     Effort: Pulmonary effort is normal.     Breath sounds: Normal breath sounds.  Abdominal:     General: Bowel sounds are normal. There is no distension.     Palpations: Abdomen is soft.     Tenderness: There is no abdominal tenderness. There is no  guarding.  Musculoskeletal:     Cervical back: Normal range of motion.  Lymphadenopathy:     Cervical: No cervical adenopathy.  Skin:    General: Skin is warm and dry.     Findings: No rash.  Neurological:     Mental Status: He is alert and oriented to person, place, and time.     Cranial Nerves: No cranial nerve deficit.     Motor: No abnormal muscle tone.  Psychiatric:        Mood and Affect: Mood normal.        Behavior: Behavior normal.     ------------------------------------------------------------------------------------------------------------------------------------------------------------------------------------------------------------------- Assessment and Plan  Well adult exam Well adult Orders Placed This Encounter  Procedures   CMP14+EGFR   CBC with Differential/Platelet   Lipid Panel With LDL/HDL Ratio   TSH   Vitamin D  (25 hydroxy)   Testosterone    Magnesium  Screening: Per lab orders Immunization: UTD Anticipatory guidance/Risk factor reduction:  Counseled on continuing to reduce EtOH intake, healthy diet and regular exercise. Additional recommendations per AVS.    No orders of the defined types were placed in this encounter.   No follow-ups on file.        [1]  Allergies Allergen Reactions   Grass Pollen([id number] Vern)     Other reaction(s): eye redness

## 2024-08-28 NOTE — Patient Instructions (Signed)

## 2024-08-29 ENCOUNTER — Telehealth: Payer: Self-pay | Admitting: Internal Medicine

## 2024-08-29 DIAGNOSIS — R002 Palpitations: Secondary | ICD-10-CM | POA: Diagnosis not present

## 2024-08-29 NOTE — Telephone Encounter (Signed)
 [facility] calling to report urgent results

## 2024-08-29 NOTE — Telephone Encounter (Signed)
 Spoke with [facility]. Unable to find anything in their system. Will call back once reason for call is determined.

## 2024-09-02 ENCOUNTER — Ambulatory Visit: Payer: Self-pay | Admitting: Internal Medicine

## 2024-09-02 DIAGNOSIS — I491 Atrial premature depolarization: Secondary | ICD-10-CM

## 2024-09-02 DIAGNOSIS — I493 Ventricular premature depolarization: Secondary | ICD-10-CM

## 2024-09-03 LAB — LIPID PANEL WITH LDL/HDL RATIO

## 2024-09-04 ENCOUNTER — Ambulatory Visit: Payer: Self-pay | Admitting: Family Medicine

## 2024-09-04 LAB — CMP14+EGFR
ALT: 17 IU/L (ref 0–44)
AST: 24 IU/L (ref 0–40)
Albumin: 4.5 g/dL (ref 4.1–5.1)
Alkaline Phosphatase: 56 IU/L (ref 47–123)
BUN/Creatinine Ratio: 15 (ref 9–20)
BUN: 16 mg/dL (ref 6–24)
Bilirubin Total: 0.5 mg/dL (ref 0.0–1.2)
CO2: 24 mmol/L (ref 20–29)
Calcium: 9.5 mg/dL (ref 8.7–10.2)
Chloride: 101 mmol/L (ref 96–106)
Creatinine, Ser: 1.09 mg/dL (ref 0.76–1.27)
Globulin, Total: 2.8 g/dL (ref 1.5–4.5)
Sodium: 138 mmol/L (ref 134–144)
Total Protein: 7.3 g/dL (ref 6.0–8.5)
eGFR: 86 mL/min/1.73

## 2024-09-04 LAB — CBC WITH DIFFERENTIAL/PLATELET
Basophils Absolute: 0 x10E3/uL (ref 0.0–0.2)
Basos: 0 %
EOS (ABSOLUTE): 0.1 x10E3/uL (ref 0.0–0.4)
Eos: 2 %
Hematocrit: 48.7 % (ref 37.5–51.0)
Hemoglobin: 16 g/dL (ref 13.0–17.7)
Immature Grans (Abs): 0 x10E3/uL (ref 0.0–0.1)
Immature Granulocytes: 0 %
Lymphocytes Absolute: 1.6 x10E3/uL (ref 0.7–3.1)
Lymphs: 19 %
MCH: 30.6 pg (ref 26.6–33.0)
MCHC: 32.9 g/dL (ref 31.5–35.7)
MCV: 93 fL (ref 79–97)
Monocytes Absolute: 0.6 x10E3/uL (ref 0.1–0.9)
Monocytes: 8 %
Neutrophils Absolute: 5.9 x10E3/uL (ref 1.4–7.0)
Neutrophils: 71 %
Platelets: 254 x10E3/uL (ref 150–450)
RBC: 5.23 x10E6/uL (ref 4.14–5.80)
RDW: 12.1 % (ref 11.6–15.4)
WBC: 8.2 x10E3/uL (ref 3.4–10.8)

## 2024-09-04 LAB — MAGNESIUM: Magnesium: 2.3 mg/dL (ref 1.6–2.3)

## 2024-09-04 LAB — TSH: TSH: 1.43 u[IU]/mL (ref 0.450–4.500)

## 2024-09-04 LAB — LIPID PANEL WITH LDL/HDL RATIO
Cholesterol, Total: 161 mg/dL (ref 100–199)
HDL: 60 mg/dL
LDL Chol Calc (NIH): 86 mg/dL (ref 0–99)
LDL/HDL Ratio: 1.4 ratio (ref 0.0–3.6)
Triglycerides: 79 mg/dL (ref 0–149)
VLDL Cholesterol Cal: 15 mg/dL (ref 5–40)

## 2024-09-04 LAB — TESTOSTERONE: Testosterone: 515 ng/dL (ref 264–916)

## 2024-09-04 LAB — VITAMIN D 25 HYDROXY (VIT D DEFICIENCY, FRACTURES): Vit D, 25-Hydroxy: 37.8 ng/mL (ref 30.0–100.0)

## 2024-09-23 ENCOUNTER — Ambulatory Visit (HOSPITAL_COMMUNITY)
Admission: RE | Admit: 2024-09-23 | Discharge: 2024-09-23 | Disposition: A | Discharge status: Home / Self Care | Payer: Self-pay | Source: Ambulatory Visit | Attending: Cardiology | Admitting: Cardiology

## 2024-09-23 DIAGNOSIS — I10 Essential (primary) hypertension: Secondary | ICD-10-CM

## 2024-09-23 DIAGNOSIS — R002 Palpitations: Secondary | ICD-10-CM | POA: Insufficient documentation

## 2024-09-23 LAB — ECHOCARDIOGRAM COMPLETE
Area-P 1/2: 3.59 cm2
S' Lateral: 3.6 cm
